# Patient Record
Sex: Female | Born: 1995 | Race: Black or African American | Hispanic: No | Marital: Single | Smoking: Never smoker
Health system: Southern US, Community
[De-identification: ages and names within clinical notes are randomized; demographics above are authoritative.]

---

## 2014-06-18 ENCOUNTER — Emergency Department (HOSPITAL_COMMUNITY): Payer: BLUE CROSS/BLUE SHIELD

## 2014-06-18 ENCOUNTER — Encounter (HOSPITAL_COMMUNITY): Payer: Self-pay | Admitting: *Deleted

## 2014-06-18 ENCOUNTER — Emergency Department (HOSPITAL_COMMUNITY)
Admission: EM | Admit: 2014-06-18 | Discharge: 2014-06-18 | Disposition: A | Discharge status: Home / Self Care | Payer: BLUE CROSS/BLUE SHIELD | Attending: Emergency Medicine | Admitting: Emergency Medicine

## 2014-06-18 DIAGNOSIS — M25511 Pain in right shoulder: Secondary | ICD-10-CM | POA: Insufficient documentation

## 2014-06-18 DIAGNOSIS — R079 Chest pain, unspecified: Secondary | ICD-10-CM | POA: Insufficient documentation

## 2014-06-18 LAB — BASIC METABOLIC PANEL
ANION GAP: 8 (ref 5–15)
BUN: 14 mg/dL (ref 6–23)
CO2: 21 mmol/L (ref 19–32)
Calcium: 9.5 mg/dL (ref 8.4–10.5)
Chloride: 107 mmol/L (ref 96–112)
Creatinine, Ser: 0.88 mg/dL (ref 0.50–1.10)
Glucose, Bld: 109 mg/dL — ABNORMAL HIGH (ref 70–99)
Potassium: 3.6 mmol/L (ref 3.5–5.1)
SODIUM: 136 mmol/L (ref 135–145)

## 2014-06-18 LAB — CBC
HEMATOCRIT: 35.6 % — AB (ref 36.0–46.0)
Hemoglobin: 11.5 g/dL — ABNORMAL LOW (ref 12.0–15.0)
MCH: 29.6 pg (ref 26.0–34.0)
MCHC: 32.3 g/dL (ref 30.0–36.0)
MCV: 91.5 fL (ref 78.0–100.0)
Platelets: 232 10*3/uL (ref 150–400)
RBC: 3.89 MIL/uL (ref 3.87–5.11)
RDW: 12.3 % (ref 11.5–15.5)
WBC: 3.8 10*3/uL — ABNORMAL LOW (ref 4.0–10.5)

## 2014-06-18 LAB — I-STAT TROPONIN, ED: TROPONIN I, POC: 0 ng/mL (ref 0.00–0.08)

## 2014-06-18 MED ORDER — PANTOPRAZOLE SODIUM 40 MG PO TBEC
40.0000 mg | DELAYED_RELEASE_TABLET | Freq: Once | ORAL | Status: AC
  Administered 2014-06-18: 40 mg via ORAL
  Filled 2014-06-18: qty 1

## 2014-06-18 MED ORDER — PANTOPRAZOLE SODIUM 20 MG PO TBEC: 20.0000 mg | DELAYED_RELEASE_TABLET | Freq: Every day | ORAL | Status: DC

## 2014-06-18 MED ORDER — GI COCKTAIL ~~LOC~~
30.0000 mL | Freq: Once | ORAL | Status: AC
  Administered 2014-06-18: 30 mL via ORAL
  Filled 2014-06-18: qty 30

## 2014-06-18 NOTE — ED Provider Notes (Signed)
CSN: [id number]     Arrival date & time 06/18/14  0246 History   First MD Initiated Contact with Patient 06/18/14 [phone]     Chief Complaint  Patient presents with  . Chest Pain     (Consider location/radiation/quality/duration/timing/severity/associated sxs/prior Treatment) Patient is a 19 y.o. female presenting with chest pain. The history is provided by the patient.  Chest Pain She complains of midsternal chest discomfort which started last night. This seems to be worse when she lays down. There is no associated dyspnea, nausea, diaphoresis. There is no exertional component. She has not taken anything to try to help this pain. She is also been having some pain in her right shoulder for the last few days. She relates that she does work out regularly but does not recall any specific injury to her right shoulder. Right shoulder pain is much worse with movement. Nothing makes it better. There is no numbness or tingling. She is a nonsmoker and there is no history of hypertension or diabetes or hyperlipidemia.  History reviewed. No pertinent past medical history. History reviewed. No pertinent past surgical history. History reviewed. No pertinent family history. History  Substance Use Topics  . Smoking status: Never Smoker   . Smokeless tobacco: Not on file  . Alcohol Use: Not on file   OB History    No data available     Review of Systems  Cardiovascular: Positive for chest pain.  All other systems reviewed and are negative.     Allergies  Review of patient's allergies indicates no known allergies.  Home Medications   Prior to Admission medications   Medication Sig Start Date End Date Taking? Authorizing Provider  pantoprazole (PROTONIX) 20 MG tablet Take 1 tablet (20 mg total) by mouth daily. 06/18/14   Dione Booze, MD   BP 107/64 mmHg  Pulse 57  Temp(Src) 98.1 F (36.7 C) (Oral)  Resp 16  Ht  (1.727 m)  Wt 138 lb (62.596 kg)  BMI 20.99 kg/m2  SpO2 100%  LMP  06/11/2014 Physical Exam  Nursing note and vitals reviewed.  19 year old female, resting comfortably and in no acute distress. Vital signs are significant for bradycardia. Oxygen saturation is 100%, which is normal. Head is normocephalic and atraumatic. PERRLA, EOMI. Oropharynx is clear. Neck is nontender and supple without adenopathy or JVD. Back is nontender and there is no CVA tenderness. Lungs are clear without rales, wheezes, or rhonchi. Chest is nontender. Heart has regular rate and rhythm without murmur. Abdomen is soft, flat, nontender without masses or hepatosplenomegaly and peristalsis is normoactive. Extremities have no cyanosis or edema, full range of motion is present. There is moderate tenderness in the right anterior deltoid groove and rotator cuff impingement signs are present. Skin is warm and dry without rash. Neurologic: Mental status is normal, cranial nerves are intact, there are no motor or sensory deficits.  ED Course  Procedures (including critical care time) Labs Review Results for orders placed or performed during the hospital encounter of 06/18/14  CBC  Result Value Ref Range   WBC 3.8 (L) 4.0 - 10.5 K/uL   RBC 3.89 3.87 - 5.11 MIL/uL   Hemoglobin 11.5 (L) 12.0 - 15.0 g/dL   HCT 11.9 (L) 14.7 - 82.9 %   MCV 91.5 78.0 - 100.0 fL   MCH 29.6 26.0 - 34.0 pg   MCHC 32.3 30.0 - 36.0 g/dL   RDW 56.2 13.0 - 86.5 %   Platelets 232 150 - 400 K/uL  Basic metabolic panel  Result Value Ref Range   Sodium 136 135 - 145 mmol/L   Potassium 3.6 3.5 - 5.1 mmol/L   Chloride 107 96 - 112 mmol/L   CO2 21 19 - 32 mmol/L   Glucose, Bld 109 (H) 70 - 99 mg/dL   BUN 14 6 - 23 mg/dL   Creatinine, Ser 8.110.88 0.50 - 1.10 mg/dL   Calcium 9.5 8.4 - 91.410.5 mg/dL   GFR calc non Af Amer >90 >90 mL/min   GFR calc Af Amer >90 >90 mL/min   Anion gap 8 5 - 15  I-stat troponin, ED [facility])  Result Value Ref Range   Troponin i, poc 0.00 0.00 - 0.08 ng/mL   Comment 3            Imaging Review Dg Chest 2 View  06/18/2014   CLINICAL DATA:  Acute onset of centralized chest pain. Initial encounter.  EXAM: CHEST  2 VIEW  COMPARISON:  None.  FINDINGS: The lungs are well-aerated and clear. There is no evidence of focal opacification, pleural effusion or pneumothorax.  The heart is normal in size; the mediastinal contour is within normal limits. No acute osseous abnormalities are seen.  IMPRESSION: No acute cardiopulmonary process seen.   Electronically Signed   By: Roanna RaiderJeffery  Chang M.D.   On: 06/18/2014 03:50     EKG Interpretation   Date/Time:  Wednesday June 18 2014 02:51:18 EDT Ventricular Rate:  64 PR Interval:  132 QRS Duration: 72 QT Interval:  414 QTC Calculation: 427 R Axis:   77 Text Interpretation:  Sinus rhythm with Premature atrial complexes  Otherwise normal ECG No old tracing to compare Confirmed by [facility]  MD,  Moroni Nester ([id number]) on 06/18/2014 2:55:07 AM      MDM   Final diagnoses:  Chest pain, unspecified chest pain type  Pain in right shoulder    Chest pain suspicious for gastroesophageal reflux disease. Right shoulder pain which appears to be rotator cuff syndrome. She was given a therapeutic trial of a GI cocktail with excellent relief of her chest discomfort. At this point, I'm concerned that giving NSAIDs for rotator cuff injury may ask he worsen her gastroesophageal reflux. I have explained this to the patient. She is referred to orthopedics for follow-up and has been given a prescription for pantoprazole.    Dione Boozeavid Brayn Eckstein, MD 06/18/14 [phone]

## 2014-06-18 NOTE — ED Notes (Signed)
Pt. Refused wheelchair and left with all belongings 

## 2014-06-18 NOTE — ED Notes (Signed)
Pt in c/o central chest pain that is worse with movement, also right shoulder pain that is worse with movement, no distress noted

## 2014-06-18 NOTE — Discharge Instructions (Signed)
Gastroesophageal Reflux Disease, Adult Gastroesophageal reflux disease (GERD) happens when acid from your stomach flows up into the esophagus. When acid comes in contact with the esophagus, the acid causes soreness (inflammation) in the esophagus. Over time, GERD may create small holes (ulcers) in the lining of the esophagus. CAUSES   Increased body weight. This puts pressure on the stomach, making acid rise from the stomach into the esophagus.  Smoking. This increases acid production in the stomach.  Drinking alcohol. This causes decreased pressure in the lower esophageal sphincter (valve or ring of muscle between the esophagus and stomach), allowing acid from the stomach into the esophagus.  Late evening meals and a full stomach. This increases pressure and acid production in the stomach.  A malformed lower esophageal sphincter. Sometimes, no cause is found. SYMPTOMS   Burning pain in the lower part of the mid-chest behind the breastbone and in the mid-stomach area. This may occur twice a week or more often.  Trouble swallowing.  Sore throat.  Dry cough.  Asthma-like symptoms including chest tightness, shortness of breath, or wheezing. DIAGNOSIS  Your caregiver may be able to diagnose GERD based on your symptoms. In some cases, X-rays and other tests may be done to check for complications or to check the condition of your stomach and esophagus. TREATMENT  Your caregiver may recommend over-the-counter or prescription medicines to help decrease acid production. Ask your caregiver before starting or adding any new medicines.  HOME CARE INSTRUCTIONS   Change the factors that you can control. Ask your caregiver for guidance concerning weight loss, quitting smoking, and alcohol consumption.  Avoid foods and drinks that make your symptoms worse, such as:  Caffeine or alcoholic drinks.  Chocolate.  Peppermint or mint flavorings.  Garlic and onions.  Spicy foods.  Citrus fruits,  such as oranges, lemons, or limes.  Tomato-based foods such as sauce, chili, salsa, and pizza.  Fried and fatty foods.  Avoid lying down for the 3 hours prior to your bedtime or prior to taking a nap.  Eat small, frequent meals instead of large meals.  Wear loose-fitting clothing. Do not wear anything tight around your waist that causes pressure on your stomach.  Raise the head of your bed 6 to 8 inches with wood blocks to help you sleep. Extra pillows will not help.  Only take over-the-counter or prescription medicines for pain, discomfort, or fever as directed by your caregiver.  Do not take aspirin, ibuprofen, or other nonsteroidal anti-inflammatory drugs (NSAIDs). SEEK IMMEDIATE MEDICAL CARE IF:   You have pain in your arms, neck, jaw, teeth, or back.  Your pain increases or changes in intensity or duration.  You develop nausea, vomiting, or sweating (diaphoresis).  You develop shortness of breath, or you faint.  Your vomit is green, yellow, black, or looks like coffee grounds or blood.  Your stool is red, bloody, or black. These symptoms could be signs of other problems, such as heart disease, gastric bleeding, or esophageal bleeding. MAKE SURE YOU:   Understand these instructions.  Will watch your condition.  Will get help right away if you are not doing well or get worse. Document Released: 11/17/2004 Document Revised: 05/02/2011 Document Reviewed: 08/27/2010 [facility] Patient Information 2015 [place], [place]. This information is not intended to replace advice given to you by your health care provider. Make sure you discuss any questions you have with your health care provider.  Rotator Cuff Injury Rotator cuff injury is any type of injury to the set of  muscles and tendons that make up the stabilizing unit of your shoulder. This unit holds the ball of your upper arm bone (humerus) in the socket of your shoulder blade (scapula).  CAUSES Injuries to your rotator cuff  most commonly come from sports or activities that cause your arm to be moved repeatedly over your head. Examples of this include throwing, weight lifting, swimming, or racquet sports. Long lasting (chronic) irritation of your rotator cuff can cause soreness and swelling (inflammation), bursitis, and eventual damage to your tendons, such as a tear (rupture). SIGNS AND SYMPTOMS Acute rotator cuff tear:  Sudden tearing sensation followed by severe pain shooting from your upper shoulder down your arm toward your elbow.  Decreased range of motion of your shoulder because of pain and muscle spasm.  Severe pain.  Inability to raise your arm out to the side because of pain and loss of muscle power (large tears). Chronic rotator cuff tear:  Pain that usually is worse at night and may interfere with sleep.  Gradual weakness and decreased shoulder motion as the pain worsens.  Decreased range of motion. Rotator cuff tendinitis:  Deep ache in your shoulder and the outside upper arm over your shoulder.  Pain that comes on gradually and becomes worse when lifting your arm to the side or turning it inward. DIAGNOSIS Rotator cuff injury is diagnosed through a medical history, physical exam, and imaging exam. The medical history helps determine the type of rotator cuff injury. Your health care provider will look at your injured shoulder, feel the injured area, and ask you to move your shoulder in different positions. X-ray exams typically are done to rule out other causes of shoulder pain, such as fractures. MRI is the exam of choice for the most severe shoulder injuries because the images show muscles and tendons.  TREATMENT  Chronic tear:  Medicine for pain, such as acetaminophen or ibuprofen.  Physical therapy and range-of-motion exercises may be helpful in maintaining shoulder function and strength.  Steroid injections into your shoulder joint.  Surgical repair of the rotator cuff if the injury  does not heal with noninvasive treatment. Acute tear:  Anti-inflammatory medicines such as ibuprofen and naproxen to help reduce pain and swelling.  A sling to help support your arm and rest your rotator cuff muscles. Long-term use of a sling is not advised. It may cause significant stiffening of the shoulder joint.  Surgery may be considered within a few weeks, especially in younger, active people, to return the shoulder to full function.  Indications for surgical treatment include the following:  Age younger than 60 years.  Rotator cuff tears that are complete.  Physical therapy, rest, and anti-inflammatory medicines have been used for 6-8 weeks, with no improvement.  Employment or sporting activity that requires constant shoulder use. Tendinitis:  Anti-inflammatory medicines such as ibuprofen and naproxen to help reduce pain and swelling.  A sling to help support your arm and rest your rotator cuff muscles. Long-term use of a sling is not advised. It may cause significant stiffening of the shoulder joint.  Severe tendinitis may require:  Steroid injections into your shoulder joint.  Physical therapy.  Surgery. HOME CARE INSTRUCTIONS   Apply ice to your injury:  Put ice in a plastic bag.  Place a towel between your skin and the bag.  Leave the ice on for 20 minutes, 2-3 times a day.  If you have a shoulder immobilizer (sling and straps), wear it until told otherwise by your health  care provider.  You may want to sleep on several pillows or in a recliner at night to lessen swelling and pain.  Only take over-the-counter or prescription medicines for pain, discomfort, or fever as directed by your health care provider.  Do simple hand squeezing exercises with a soft rubber ball to decrease hand swelling. SEEK MEDICAL CARE IF:   Your shoulder pain increases, or new pain or numbness develops in your arm, hand, or fingers.  Your hand or fingers are colder than your  other hand. SEEK IMMEDIATE MEDICAL CARE IF:   Your arm, hand, or fingers are numb or tingling.  Your arm, hand, or fingers are increasingly swollen and painful, or they turn white or blue. MAKE SURE YOU:  Understand these instructions.  Will watch your condition.  Will get help right away if you are not doing well or get worse. Document Released: 02/05/2000 Document Revised: 02/12/2013 Document Reviewed: 09/19/2012 [facility] Patient Information 2015 [place], [place]. This information is not intended to replace advice given to you by your health care provider. Make sure you discuss any questions you have with your health care provider.  Pantoprazole tablets What is this medicine? PANTOPRAZOLE (pan TOE pra zole) prevents the production of acid in the stomach. It is used to treat gastroesophageal reflux disease (GERD), inflammation of the esophagus, and Zollinger-Ellison syndrome. This medicine may be used for other purposes; ask your health care provider or pharmacist if you have questions. COMMON BRAND NAME(S): Protonix What should I tell my health care provider before I take this medicine? They need to know if you have any of these conditions: -liver disease -low levels of magnesium in the blood -an unusual or allergic reaction to omeprazole, lansoprazole, pantoprazole, rabeprazole, other medicines, foods, dyes, or preservatives -pregnant or trying to get pregnant -breast-feeding How should I use this medicine? Take this medicine by mouth. Swallow the tablets whole with a drink of water. Follow the directions on the prescription label. Do not crush, break, or chew. Take your medicine at regular intervals. Do not take your medicine more often than directed. Talk to your pediatrician regarding the use of this medicine in children. While this drug may be prescribed for children as young as 5 years for selected conditions, precautions do apply. Overdosage: If you think you have taken too  much of this medicine contact a poison control center or emergency room at once. NOTE: This medicine is only for you. Do not share this medicine with others. What if I miss a dose? If you miss a dose, take it as soon as you can. If it is almost time for your next dose, take only that dose. Do not take double or extra doses. What may interact with this medicine? Do not take this medicine with any of the following medications: -atazanavir -nelfinavir This medicine may also interact with the following medications: -ampicillin -delavirdine -digoxin -diuretics -iron salts -medicines for fungal infections like ketoconazole, itraconazole and voriconazole -warfarin This list may not describe all possible interactions. Give your health care provider a list of all the medicines, herbs, non-prescription drugs, or dietary supplements you use. Also tell them if you smoke, drink alcohol, or use illegal drugs. Some items may interact with your medicine. What should I watch for while using this medicine? It can take several days before your stomach pain gets better. Check with your doctor or health care professional if your condition does not start to get better, or if it gets worse. You may need blood work  done while you are taking this medicine. What side effects may I notice from receiving this medicine? Side effects that you should report to your doctor or health care professional as soon as possible: -allergic reactions like skin rash, itching or hives, swelling of the face, lips, or tongue -bone, muscle or joint pain -breathing problems -chest pain or chest tightness -dark yellow or brown urine -dizziness -fast, irregular heartbeat -feeling faint or lightheaded -fever or sore throat -muscle spasm -palpitations -redness, blistering, peeling or loosening of the skin, including inside the mouth -seizures -tremors -unusual bleeding or bruising -unusually weak or tired -yellowing of the eyes  or skin Side effects that usually do not require medical attention (Report these to your doctor or health care professional if they continue or are bothersome.): -constipation -diarrhea -dry mouth -headache -nausea This list may not describe all possible side effects. Call your doctor for medical advice about side effects. You may report side effects to FDA at 1-800-FDA-1088. Where should I keep my medicine? Keep out of the reach of children. Store at room temperature between 15 and 30 degrees C (59 and 86 degrees F). Protect from light and moisture. Throw away any unused medicine after the expiration date. NOTE: This sheet is a summary. It may not cover all possible information. If you have questions about this medicine, talk to your doctor, pharmacist, or health care provider.  2015, Elsevier/[employer]. (2011-12-07 16:40:16)

## 2015-01-05 ENCOUNTER — Emergency Department (HOSPITAL_COMMUNITY)
Admission: EM | Admit: 2015-01-05 | Discharge: 2015-01-05 | Disposition: A | Discharge status: Home / Self Care | Payer: BLUE CROSS/BLUE SHIELD | Attending: Emergency Medicine | Admitting: Emergency Medicine

## 2015-01-05 ENCOUNTER — Encounter (HOSPITAL_COMMUNITY): Payer: Self-pay | Admitting: *Deleted

## 2015-01-05 DIAGNOSIS — J069 Acute upper respiratory infection, unspecified: Secondary | ICD-10-CM

## 2015-01-05 DIAGNOSIS — R1310 Dysphagia, unspecified: Secondary | ICD-10-CM | POA: Insufficient documentation

## 2015-01-05 DIAGNOSIS — B9789 Other viral agents as the cause of diseases classified elsewhere: Secondary | ICD-10-CM

## 2015-01-05 DIAGNOSIS — Z79899 Other long term (current) drug therapy: Secondary | ICD-10-CM | POA: Insufficient documentation

## 2015-01-05 MED ORDER — PHENYLEPH-PROMETHAZINE-COD 5-6.25-10 MG/5ML PO SYRP: 5.0000 mL | ORAL_SOLUTION | Freq: Four times a day (QID) | ORAL | Status: DC | PRN

## 2015-01-05 NOTE — Discharge Instructions (Signed)
Upper Respiratory Infection, Adult Most upper respiratory infections (URIs) are a viral infection of the air passages leading to the lungs. A URI affects the nose, throat, and upper air passages. The most common type of URI is nasopharyngitis and is typically referred to as "the common cold." URIs run their course and usually go away on their own. Most of the time, a URI does not require medical attention, but sometimes a bacterial infection in the upper airways can follow a viral infection. This is called a secondary infection. Sinus and middle ear infections are common types of secondary upper respiratory infections. Bacterial pneumonia can also complicate a URI. A URI can worsen asthma and chronic obstructive pulmonary disease (COPD). Sometimes, these complications can require emergency medical care and may be life threatening.  CAUSES Almost all URIs are caused by viruses. A virus is a type of germ and can spread from one person to another.  RISKS FACTORS You may be at risk for a URI if:   You smoke.   You have chronic heart or lung disease.  You have a weakened defense (immune) system.   You are very young or very old.   You have nasal allergies or asthma.  You work in crowded or poorly ventilated areas.  You work in health care facilities or schools. SIGNS AND SYMPTOMS  Symptoms typically develop 2-3 days after you come in contact with a cold virus. Most viral URIs last 7-10 days. However, viral URIs from the influenza virus (flu virus) can last 14-18 days and are typically more severe. Symptoms may include:   Runny or stuffy (congested) nose.   Sneezing.   Cough.   Sore throat.   Headache.   Fatigue.   Fever.   Loss of appetite.   Pain in your forehead, behind your eyes, and over your cheekbones (sinus pain).  Muscle aches.  DIAGNOSIS  Your health care provider may diagnose a URI by:  Physical exam.  Tests to check that your symptoms are not due to  another condition such as:  Strep throat.  Sinusitis.  Pneumonia.  Asthma. TREATMENT  A URI goes away on its own with time. It cannot be cured with medicines, but medicines may be prescribed or recommended to relieve symptoms. Medicines may help:  Reduce your fever.  Reduce your cough.  Relieve nasal congestion. HOME CARE INSTRUCTIONS   Take medicines only as directed by your health care provider.   Gargle warm saltwater or take cough drops to comfort your throat as directed by your health care provider.  Use a warm mist humidifier or inhale steam from a shower to increase air moisture. This may make it easier to breathe.  Drink enough fluid to keep your urine clear or pale yellow.   Eat soups and other clear broths and maintain good nutrition.   Rest as needed.   Return to work when your temperature has returned to normal or as your health care provider advises. You may need to stay home longer to avoid infecting others. You can also use a face mask and careful hand washing to prevent spread of the virus.  Increase the usage of your inhaler if you have asthma.   Do not use any tobacco products, including cigarettes, chewing tobacco, or electronic cigarettes. If you need help quitting, ask your health care provider. PREVENTION  The best way to protect yourself from getting a cold is to practice good hygiene.   Avoid oral or hand contact with people with cold   symptoms.   Wash your hands often if contact occurs.  There is no clear evidence that vitamin C, vitamin E, echinacea, or exercise reduces the chance of developing a cold. However, it is always recommended to get plenty of rest, exercise, and practice good nutrition.  SEEK MEDICAL CARE IF:   You are getting worse rather than better.   Your symptoms are not controlled by medicine.   You have chills.  You have worsening shortness of breath.  You have brown or red mucus.  You have yellow or brown nasal  discharge.  You have pain in your face, especially when you bend forward.  You have a fever.  You have swollen neck glands.  You have pain while swallowing.  You have white areas in the back of your throat. SEEK IMMEDIATE MEDICAL CARE IF:   You have severe or persistent:  Headache.  Ear pain.  Sinus pain.  Chest pain.  You have chronic lung disease and any of the following:  Wheezing.  Prolonged cough.  Coughing up blood.  A change in your usual mucus.  You have a stiff neck.  You have changes in your:  Vision.  Hearing.  Thinking.  Mood. MAKE SURE YOU:   Understand these instructions.  Will watch your condition.  Will get help right away if you are not doing well or get worse.   This information is not intended to replace advice given to you by your health care provider. Make sure you discuss any questions you have with your health care provider.   Document Released: 08/03/2000 Document Revised: 06/24/2014 Document Reviewed: 05/15/2013 Elsevier Interactive Patient Education 2016 [employer].  

## 2015-01-05 NOTE — ED Notes (Signed)
Pt in c/o intermittent sore throat and cough for the last two weeks, worse in the last few days, denies fever, no distress noted

## 2015-01-05 NOTE — ED Provider Notes (Signed)
CSN: [id number]     Arrival date & time 01/05/15  1809 History  By signing my name below, I, Alisha Fuentes, attest that this documentation has been prepared under the direction and in the presence of Alisha Mornavid Yoni Lobos, NP. Electronically Signed: Murriel HopperAlec Fuentes, ED Scribe. 01/05/2015. 7:46 PM.    Chief Complaint  Patient presents with  . URI      Patient is a 19 y.o. female presenting with URI. The history is provided by the patient. No language interpreter was used.  URI Presenting symptoms: cough   Presenting symptoms: no ear pain and no fever   Severity:  Mild Onset quality:  Gradual Duration:  2 weeks Timing:  Constant Chronicity:  New Associated symptoms: no wheezing    HPI Comments: Alisha Fuentes is a 19 y.o. female who presents to the Emergency Department complaining of constant, worsening sore throat with associated productive cough and trouble swallowing that has been present for about two weeks. Pt reports her cough is worse at night, especially when she lays down. Pt denies fever, wheezing, abdominal pain, nausea, vomiting, ear pain.   History reviewed. No pertinent past medical history. History reviewed. No pertinent past surgical history. History reviewed. No pertinent family history. Social History  Substance Use Topics  . Smoking status: Never Smoker   . Smokeless tobacco: None  . Alcohol Use: None   OB History    No data available     Review of Systems  Constitutional: Negative for fever.  HENT: Positive for trouble swallowing. Negative for ear pain.   Respiratory: Positive for cough. Negative for wheezing.   Gastrointestinal: Negative for nausea, vomiting and abdominal pain.  All other systems reviewed and are negative.     Allergies  Review of patient's allergies indicates no known allergies.  Home Medications   Prior to Admission medications   Medication Sig Start Date End Date Taking? Authorizing Provider  pantoprazole (PROTONIX) 20 MG tablet Take  1 tablet (20 mg total) by mouth daily. 06/18/14   Alisha Boozeavid Glick, MD   BP 109/66 mmHg  Pulse 62  Temp(Src) 97.8 F (36.6 C) (Oral)  Resp 20  SpO2 100%  LMP 12/29/2014 Physical Exam  Constitutional: She is oriented to person, place, and time. She appears well-developed and well-nourished.  HENT:  Head: Normocephalic and atraumatic.  Cobblestoning in lower pharynx No cervical lymphadenopathy  Cardiovascular: Normal rate.   Pulmonary/Chest: Effort normal.  Abdominal: She exhibits no distension.  Lymphadenopathy:    She has no cervical adenopathy.  Neurological: She is alert and oriented to person, place, and time.  Skin: Skin is warm and dry.  Psychiatric: She has a normal mood and affect.  Nursing note and vitals reviewed.   ED Course  Procedures (including critical care time)  DIAGNOSTIC STUDIES: Oxygen Saturation is 100% on room air, normal by my interpretation.    COORDINATION OF CARE: 7:46 PM Discussed treatment plan with pt at bedside and pt agreed to plan.   Labs Review Labs Reviewed - No data to display  Imaging Review No results found. I have personally reviewed and evaluated these images and lab results as part of my medical decision-making.   EKG Interpretation None      MDM   Final diagnoses:  None    Viral URI with cough. Care instructions provided. Return precautions discussed.  I personally performed the services described in this documentation, which was scribed in my presence. The recorded information has been reviewed and is accurate.    Alisha Fuentes  Alisha Blazing, NP 01/06/15 1610  Mancel Bale, MD 01/08/15 [phone]

## 2016-05-10 ENCOUNTER — Ambulatory Visit (INDEPENDENT_AMBULATORY_CARE_PROVIDER_SITE_OTHER): Payer: Self-pay | Admitting: Physician Assistant

## 2016-05-10 VITALS — BP 108/60 | HR 83 | Temp 101.3°F | Resp 16 | Ht 67.0 in | Wt 136.4 lb

## 2016-05-10 DIAGNOSIS — B9689 Other specified bacterial agents as the cause of diseases classified elsewhere: Secondary | ICD-10-CM

## 2016-05-10 DIAGNOSIS — R509 Fever, unspecified: Secondary | ICD-10-CM

## 2016-05-10 DIAGNOSIS — N760 Acute vaginitis: Secondary | ICD-10-CM

## 2016-05-10 DIAGNOSIS — R112 Nausea with vomiting, unspecified: Secondary | ICD-10-CM

## 2016-05-10 DIAGNOSIS — N10 Acute tubulo-interstitial nephritis: Secondary | ICD-10-CM

## 2016-05-10 DIAGNOSIS — R102 Pelvic and perineal pain: Secondary | ICD-10-CM

## 2016-05-10 LAB — POCT URINALYSIS DIP (MANUAL ENTRY)
BILIRUBIN UA: NEGATIVE
BILIRUBIN UA: NEGATIVE
GLUCOSE UA: NEGATIVE
Leukocytes, UA: NEGATIVE
Nitrite, UA: POSITIVE — AB
Protein Ur, POC: 30 — AB
SPEC GRAV UA: 1.03 (ref 1.030–1.035)
Urobilinogen, UA: 1 (ref ?–2.0)
pH, UA: 6 (ref 5.0–8.0)

## 2016-05-10 LAB — POC MICROSCOPIC URINALYSIS (UMFC): Mucus: ABSENT

## 2016-05-10 LAB — POCT WET + KOH PREP
TRICH BY WET PREP: ABSENT
Yeast by KOH: ABSENT
Yeast by wet prep: ABSENT

## 2016-05-10 LAB — POCT URINE PREGNANCY: Preg Test, Ur: NEGATIVE

## 2016-05-10 MED ORDER — METRONIDAZOLE 500 MG PO TABS: 500.0000 mg | ORAL_TABLET | Freq: Two times a day (BID) | ORAL | 0 refills | Status: AC

## 2016-05-10 MED ORDER — CIPROFLOXACIN HCL 500 MG PO TABS: 500.0000 mg | ORAL_TABLET | Freq: Two times a day (BID) | ORAL | 0 refills | Status: AC

## 2016-05-10 MED ORDER — ONDANSETRON 8 MG PO TBDP: 8.0000 mg | ORAL_TABLET | Freq: Three times a day (TID) | ORAL | 0 refills | Status: AC | PRN

## 2016-05-10 MED ORDER — ACETAMINOPHEN 500 MG PO TABS
500.0000 mg | ORAL_TABLET | Freq: Once | ORAL | Status: AC
  Administered 2016-05-10: 500 mg via ORAL

## 2016-05-10 NOTE — Progress Notes (Signed)
PRIMARY CARE AT [facility] [address], [place] [address] [phone]  Date:  05/10/2016   Name:  Alisha Fuentes   DOB:  11-24-1995   MRN:  [id number]  PCP:  No PCP Per Patient    History of Present Illness:  Alisha Fuentes is a 21 y.o. female patient who presents to PCP with  Chief Complaint  Patient presents with  . Fever    Pt states whenever she has an alcoholic beverage she has these symptoms. Per pt she only had one glass of wine last night  . Chills  . Abdominal Pain    & Headache  . Generalized Body Aches     Waking with fever and chills and migraine, which will last for 1 day and return.  She will have alcohol, and this will start up.  She had a glass of wine--abdominal area felt tight and she felt woozy.  She then developed chills.  No dysuria, or hematuria.  She does have frequency.  Mild back pain. No congestion, sore throat, or coughing.   No abnormal discharge, color, or pruritus.   Sexually active, unprotected, months ago.     There are no active problems to display for this patient.   No past medical history on file.  No past surgical history on file.  Social History  Substance Use Topics  . Smoking status: Never Smoker  . Smokeless tobacco: Never Used  . Alcohol use Not on file    No family history on file.  No Known Allergies  Medication list has been reviewed and updated.  No current outpatient prescriptions on file prior to visit.   No current facility-administered medications on file prior to visit.     ROS ROS otherwise unremarkable unless listed above.  Physical Examination: BP 108/60   Pulse 83   Temp (!) 101.3 F (38.5 C) (Oral)   Resp 16   Ht 5\' 7"  (1.702 m)   Wt 136 lb 6.4 oz (61.9 kg)   SpO2 97%   BMI 21.36 kg/m  Ideal Body Weight: Weight in (lb) to have BMI = 25: 159.3  Physical Exam  Constitutional: She is oriented to person, place, and time. She appears well-developed and well-nourished. No distress.  HENT:   Head: Normocephalic and atraumatic.  Right Ear: External ear normal.  Left Ear: External ear normal.  Eyes: Conjunctivae and EOM are normal. Pupils are equal, round, and reactive to light.  Cardiovascular: Normal rate.   Pulmonary/Chest: Effort normal. No respiratory distress.  Abdominal: Soft. Normal appearance and bowel sounds are normal. There is tenderness in the suprapubic area. There is no CVA tenderness and no tenderness at McBurney's point.  Neurological: She is alert and oriented to person, place, and time.  Skin: She is not diaphoretic.  Psychiatric: She has a normal mood and affect. Her behavior is normal.    Results for orders placed or performed in visit on 05/10/16  POCT urinalysis dipstick  Result Value Ref Range   Color, UA yellow yellow   Clarity, UA cloudy (A) clear   Glucose, UA negative negative   Bilirubin, UA negative negative   Ketones, POC UA negative negative   Spec Grav, UA 1.030 1.030 - 1.035   Blood, UA large (A) negative   pH, UA 6.0 5.0 - 8.0   Protein Ur, POC =30 (A) negative   Urobilinogen, UA 1.0 Negative - 2.0   Nitrite, UA Positive (A) Negative   Leukocytes, UA Negative Negative   Assessment and  Plan: Alisha Fuentes is a 21 y.o. female who is here today for cc of abdominal pain, fever, chills, and bodyaches. --likely bladder infection.  Will cover for pyelo given fever and sxs.   --advised alarming sxs to warrant an immediate return. Fever, unspecified fever cause - Plan: acetaminophen (TYLENOL) tablet 500 mg, POCT urinalysis dipstick, POCT Wet + KOH Prep, POCT urine pregnancy, POCT Microscopic Urinalysis ([facility])  Suprapubic pain - Plan: POCT Wet + KOH Prep, POCT urine pregnancy, POCT Microscopic Urinalysis ([facility])  Acute pyelonephritis - Plan: ciprofloxacin (CIPRO) 500 MG tablet  Nausea and vomiting, intractability of vomiting not specified, unspecified vomiting type - Plan: ondansetron (ZOFRAN-ODT) 8 MG disintegrating tablet  Bacterial  vaginosis - Plan: metroNIDAZOLE (FLAGYL) 500 MG tablet  Trena PlattStephanie English, PA-C [facility] 05/13/2016 828am

## 2016-05-10 NOTE — Patient Instructions (Addendum)
I would like you to continue to hydrate. You can take tylenol or ibuprofen for your pain or fever.  Pyelonephritis, Adult Pyelonephritis is a kidney infection. The kidneys are organs that help clean your blood by moving waste out of your blood and into your pee (urine). This infection can happen quickly, or it can last for a long time. In most cases, it clears up with treatment and does not cause other problems. Follow these instructions at home: Medicines   Take over-the-counter and prescription medicines only as told by your doctor.  Take your antibiotic medicine as told by your doctor. Do not stop taking the medicine even if you start to feel better. General instructions   Drink enough fluid to keep your pee clear or pale yellow.  Avoid caffeine, tea, and carbonated drinks.  Pee (urinate) often. Avoid holding in pee for long periods of time.  Pee before and after sex.  After pooping (having a bowel movement), women should wipe from front to back. Use each tissue only once.  Keep all follow-up visits as told by your doctor. This is important. Contact a doctor if:  You do not feel better after 2 days.  Your symptoms get worse.  You have a fever. Get help right away if:  You cannot take your medicine or drink fluids as told.  You have chills and shaking.  You throw up (vomit).  You have very bad pain in your side (flank) or back.  You feel very weak or you pass out (faint). This information is not intended to replace advice given to you by your health care provider. Make sure you discuss any questions you have with your health care provider. Document Released: 03/17/2004 Document Revised: 07/16/2015 Document Reviewed: 06/02/2014 Elsevier Interactive Patient Education  2017 [employer]     IF you received an x-ray today, you will receive an invoice from [facility]. Please contact [facility] at [phone] with questions or concerns regarding  your invoice.   IF you received labwork today, you will receive an invoice from [facility]. Please contact [facility] at [phone] with questions or concerns regarding your invoice.   Our billing staff will not be able to assist you with questions regarding bills from these companies.  You will be contacted with the lab results as soon as they are available. The fastest way to get your results is to activate your My Chart account. Instructions are located on the last page of this paperwork. If you have not heard from us regarding the results in 2 weeks, please contact this office.

## 2016-05-12 ENCOUNTER — Encounter: Payer: Self-pay | Admitting: Emergency Medicine

## 2016-05-16 ENCOUNTER — Encounter: Payer: Self-pay | Admitting: Emergency Medicine

## 2017-03-22 ENCOUNTER — Encounter (HOSPITAL_COMMUNITY): Payer: Self-pay | Admitting: Emergency Medicine

## 2017-03-22 ENCOUNTER — Ambulatory Visit (HOSPITAL_COMMUNITY)
Admission: EM | Admit: 2017-03-22 | Discharge: 2017-03-22 | Disposition: A | Discharge status: Home / Self Care | Payer: BLUE CROSS/BLUE SHIELD | Attending: Family Medicine | Admitting: Family Medicine

## 2017-03-22 DIAGNOSIS — R05 Cough: Secondary | ICD-10-CM | POA: Diagnosis not present

## 2017-03-22 DIAGNOSIS — J029 Acute pharyngitis, unspecified: Secondary | ICD-10-CM | POA: Insufficient documentation

## 2017-03-22 LAB — POCT RAPID STREP A: Streptococcus, Group A Screen (Direct): NEGATIVE

## 2017-03-22 NOTE — ED Triage Notes (Signed)
PT C/O: cold sx onset today associated w/SOB, sore throat.  Sts throat hurts when she talks.   DENIES: fevers  TAKING MEDS: none   A&O x4... NAD... Ambulatory

## 2017-03-22 NOTE — Discharge Instructions (Signed)
You may use over the counter ibuprofen or acetaminophen as needed.  °For a sore throat, over the counter products such as [employer] Peroxyl Mouth Sore Rinse and Chloraseptic Sore Throat Spray may provide some temporary relief. °Your rapid strep test was negative today. We have sent your throat swab for culture and will let you know of any positive results. °

## 2017-03-25 LAB — CULTURE, GROUP A STREP (THRC)

## 2017-03-27 NOTE — ED Provider Notes (Signed)
  [facility]   [id number] 03/22/17 Arrival Time: 1421  ASSESSMENT & PLAN:  1. Sore throat    Results for orders placed or performed during the hospital encounter of 03/22/17  POCT rapid strep A [facility])  Result Value Ref Range   Streptococcus, Group A Screen (Direct) NEGATIVE NEGATIVE   Labs Reviewed  CULTURE, GROUP A STREP [facility])  POCT RAPID STREP A   OTC analgesics and throat care as needed Throat culture pending.  Reviewed expectations re: course of current medical issues. Questions answered. Outlined signs and symptoms indicating need for more acute intervention. Patient verbalized understanding. After Visit Summary given.   SUBJECTIVE:  Alisha Severinnesha Fuentes is a 22 y.o. female who reports a sore throat. Describes as pain with swallowing. Onset abrupt beginning 1 day ago. No respiratory symptoms except for very mild and sporadic dry cough. Normal PO intake but reports discomfort with swallowing. Fever reported: no. No associated n/v/abdominal symptoms. Sick contacts: None.  OTC treatment: None.  ROS: As per HPI.   OBJECTIVE:  Vitals:   03/22/17 1451  BP: (!) 87/44  Pulse: 64  Resp: 18  Temp: 98.9 F (37.2 C)  TempSrc: Oral  SpO2: 100%     General appearance: alert; no distress HEENT: throat with mild erythema; uvula midline Neck: supple with FROM; no cervical LAD Lungs: clear to auscultation bilaterally Skin: reveals no rash; warm and dry Psychological: alert and cooperative; normal mood and affect  No Known Allergies   Social History   Socioeconomic History  . Marital status: Single    Spouse name: Not on file  . Number of children: Not on file  . Years of education: Not on file  . Highest education level: Not on file  Social Needs  . Financial resource strain: Not on file  . Food insecurity - worry: Not on file  . Food insecurity - inability: Not on file  . Transportation needs - medical: Not on file  . Transportation needs -  non-medical: Not on file  Occupational History  . Not on file  Tobacco Use  . Smoking status: Never Smoker  . Smokeless tobacco: Never Used  Substance and Sexual Activity  . Alcohol use: Not on file  . Drug use: Not on file  . Sexual activity: Not on file  Other Topics Concern  . Not on file  Social History Narrative  . Not on file   History reviewed. No pertinent family history.        Mardella LaymanHagler, Dereona Kolodny, MD 03/27/17 0930

## 2017-05-07 ENCOUNTER — Encounter (HOSPITAL_COMMUNITY): Payer: Self-pay | Admitting: *Deleted

## 2017-05-07 ENCOUNTER — Emergency Department (HOSPITAL_COMMUNITY)
Admission: EM | Admit: 2017-05-07 | Discharge: 2017-05-07 | Disposition: A | Discharge status: Home / Self Care | Payer: BLUE CROSS/BLUE SHIELD | Attending: Emergency Medicine | Admitting: Emergency Medicine

## 2017-05-07 ENCOUNTER — Other Ambulatory Visit: Payer: Self-pay

## 2017-05-07 ENCOUNTER — Emergency Department (HOSPITAL_COMMUNITY): Payer: BLUE CROSS/BLUE SHIELD

## 2017-05-07 DIAGNOSIS — W208XXA Other cause of strike by thrown, projected or falling object, initial encounter: Secondary | ICD-10-CM | POA: Insufficient documentation

## 2017-05-07 DIAGNOSIS — S63615A Unspecified sprain of left ring finger, initial encounter: Secondary | ICD-10-CM | POA: Insufficient documentation

## 2017-05-07 DIAGNOSIS — S6992XA Unspecified injury of left wrist, hand and finger(s), initial encounter: Secondary | ICD-10-CM | POA: Diagnosis present

## 2017-05-07 DIAGNOSIS — Y9389 Activity, other specified: Secondary | ICD-10-CM | POA: Insufficient documentation

## 2017-05-07 DIAGNOSIS — Y9289 Other specified places as the place of occurrence of the external cause: Secondary | ICD-10-CM | POA: Insufficient documentation

## 2017-05-07 DIAGNOSIS — Y999 Unspecified external cause status: Secondary | ICD-10-CM | POA: Insufficient documentation

## 2017-05-07 LAB — POC URINE PREG, ED: Preg Test, Ur: NEGATIVE

## 2017-05-07 MED ORDER — IBUPROFEN 600 MG PO TABS: 600.0000 mg | ORAL_TABLET | Freq: Four times a day (QID) | ORAL | 0 refills | Status: DC | PRN

## 2017-05-07 NOTE — Discharge Instructions (Signed)
Please see the information and instructions below regarding your visit.  Your diagnoses today include:  1. Sprain of left ring finger, unspecified site of finger, initial encounter    Your x-ray demonstrated no fractures today.  You likely have a sprain within your finger.  Tests performed today include: See side panel of your discharge paperwork for testing performed today. Vital signs are listed at the bottom of these instructions.   Medications prescribed:    Take any prescribed medications only as prescribed, and any over the counter medications only as directed on the packaging.  You are prescribed ibuprofen, a non-steroidal anti-inflammatory agent (NSAID) for pain. You may take 600mg  every 6 hours as needed for pain. If still requiring this medication around the clock for acute pain after 10 days, please see your primary healthcare provider.  Women who are pregnant, breastfeeding, or planning on becoming pregnant should not take non-steroidal anti-inflammatories such as   You may combine this medication with Tylenol, 650 mg every 6 hours, so you are receiving something for pain every 3 hours.  This is not a long-term medication unless under the care and direction of your primary provider. Taking this medication long-term and not under the supervision of a healthcare provider could increase the risk of stomach ulcers, kidney problems, and cardiovascular problems such as high blood pressure.    Home care instructions:  Please follow any educational materials contained in this packet.   Please ice the finger 4 times a day, placing a towel in between your eyes and the finger.  Please also perform range of motion exercises, including using a stress ball to be able to practice grip strength.  Follow-up instructions: Please follow-up with your primary care provider in one week for further evaluation of your symptoms if they are not completely improved.   Return instructions:  Please  return to the Emergency Department if you experience worsening symptoms.  Return the emergency department for any increasing swelling, the finger turning white or numb. Please return if you have any other emergent concerns.  Additional Information:   Your vital signs today were: BP 95/68 (BP Location: Right Arm)    Pulse 80    Temp 98.4 F (36.9 C) (Oral)    Resp 16    Ht 5\' 8"  (1.727 m)    Wt 63.5 kg (140 lb)    LMP 04/15/2017    SpO2 100%    BMI 21.29 kg/m  If your blood pressure (BP) was elevated on multiple readings during this visit above 130 for the top number or above 80 for the bottom number, please have this repeated by your primary care provider within one month. --------------  Thank you for allowing us to participate in your care today.

## 2017-05-07 NOTE — ED Notes (Signed)
Declined W/C at D/C and was escorted to lobby by RN. 

## 2017-05-07 NOTE — ED Notes (Signed)
Urine Pregnancy POC reads Negative. Ed-lab

## 2017-05-07 NOTE — ED Triage Notes (Signed)
Pt was getting object off of upper shelf when the TV fell on her L ring finger.  She feels it is broken.

## 2017-05-07 NOTE — ED Provider Notes (Signed)
[facility] Provider Note   CSN: [id number] Arrival date & time: 05/07/17  1551     History   Chief Complaint Chief Complaint  Patient presents with  . Finger Injury    HPI Alisha Fuentes is a 22 y.o. female.  HPI  Patient is a 22 year old female with no significant past medical history presenting for a left ring finger injury.  Patient reports that she was getting ATV off the top shelf of a closet, when she lost her grip of it, and it fell down from the shelf onto her left ring finger.  Patient reports that it is most painful from the mid to distal tip of the left ring finger.  Patient reports that she has had difficulty straightening or bending the finger due to the pain and swelling.  Patient denies any loss of sensation, but does report that she has had increasing swelling over the afternoon.  No pallor to the finger.  No open wound occurred in this incident.  History reviewed. No pertinent past medical history.  There are no active problems to display for this patient.   History reviewed. No pertinent surgical history.  OB History    No data available       Home Medications    Prior to Admission medications   Not on File    Family History No family history on file.  Social History Social History   Tobacco Use  . Smoking status: Never Smoker  . Smokeless tobacco: Never Used  Substance Use Topics  . Alcohol use: Yes    Comment: occ  . Drug use: No     Allergies   Patient has no known allergies.   Review of Systems Review of Systems  Musculoskeletal: Positive for arthralgias and joint swelling.  Skin: Negative for color change and wound.  Neurological: Negative for weakness and numbness.     Physical Exam Updated Vital Signs BP 95/68 (BP Location: Right Arm)   Pulse 80   Temp 98.4 F (36.9 C) (Oral)   Resp 16   Ht 5\' 8"  (1.727 m)   Wt 63.5 kg (140 lb)   LMP 04/15/2017   SpO2 100%   BMI 21.29  kg/m   Physical Exam  Constitutional: She appears well-developed and well-nourished. No distress.  Sitting comfortably in bed.  HENT:  Head: Normocephalic and atraumatic.  Eyes: Conjunctivae are normal. Right eye exhibits no discharge. Left eye exhibits no discharge.  EOMs normal to gross examination.  Neck: Normal range of motion.  Cardiovascular: Normal rate and regular rhythm.  Intact, 2+ radial and ulnar pulse of LUE  Pulmonary/Chest:  Normal respiratory effort. Patient converses comfortably. No audible wheeze or stridor.  Abdominal: She exhibits no distension.  Musculoskeletal: Normal range of motion.  Hand Exam:  Inspection: Mild amount of swelling from the IP joint to DIP joint of the left ring finger. Palpation: Generalized tenderness to palpation along radial and ulnar aspects of the left ring finger. ROM: Passive/active ROM intact at wrist, MCP, PIP, and DIP joints, thumb MCP and IP joints, and no rotational deformity of metacarpals noted. Ligamentous stability: No laxity to valgus/varus stress of MCP, PIP, or DIP joints. No joint laxity with radial stress of thumb. Flexor/Extensor tendons: FDS/FDP tendons intact in digits 2-5 at PIP/DIP joints, respectively; extensor tendons intact in all digits Nerve testing:  Patient has sensation intact in the digital nerve distribution of the entire distribution of the left ring finger.   Vascular: 2+  radial and ulnar pulses. Capillary refill <2 seconds b/l.  Neurological: She is alert.  Cranial nerves intact to gross observation. Patient moves extremities without difficulty.  Skin: Skin is warm and dry. She is not diaphoretic.  Psychiatric: She has a normal mood and affect. Her behavior is normal. Judgment and thought content normal.  Nursing note and vitals reviewed.    ED Treatments / Results  Labs (all labs ordered are listed, but only abnormal results are displayed) Labs Reviewed  POC URINE PREG, ED    EKG  EKG  Interpretation None       Radiology Dg Finger Ring Left  Result Date: 05/07/2017 CLINICAL DATA:  Television fell on finger EXAM: LEFT FOURTH FINGER 2+V COMPARISON:  None. FINDINGS: Frontal, oblique, and lateral views were obtained. No fracture or dislocation. Joint spaces appear normal. No erosive change. IMPRESSION: No fracture or dislocation.  No evident arthropathy. Electronically Signed   By: Bretta Bang III M.D.   On: 05/07/2017 17:40    Procedures Procedures (including critical care time)  Medications Ordered in ED Medications - No data to display   Initial Impression / Assessment and Plan / ED Course  I have reviewed the triage vital signs and the nursing notes.  Pertinent labs & imaging results that were available during my care of the patient were reviewed by me and considered in my medical decision making (see chart for details).  Clinical Course as of May 07 1824  Wynelle Link May 07, 2017  1809 Negative xray noted.  [AM]    Clinical Course User Index [AM] Elisha Ponder, PA-C    Patient is well-appearing in no acute distress.  No fractures identified on radiography of the left ring finger.  Fingers neurovascularly intact, and no evidence of dislocation or ligament rupture.  Will buddy tape the finger and have patient perform range of motion exercises as well as RICE therapy at home.  Patient to use anti-inflammatories.  Return precautions given for any pallor, increasing redness, or swelling.  Patient is in understanding and agrees with the plan of care.  Final Clinical Impressions(s) / ED Diagnoses   Final diagnoses:  Sprain of left ring finger, unspecified site of finger, initial encounter    ED Discharge Orders        Ordered    ibuprofen (ADVIL,MOTRIN) 600 MG tablet  Every 6 hours PRN     05/07/17 1838       Elisha Ponder, PA-C 05/07/17 1901    Blane Ohara, MD 05/08/17 [phone]

## 2017-08-01 ENCOUNTER — Ambulatory Visit (HOSPITAL_COMMUNITY)
Admission: EM | Admit: 2017-08-01 | Discharge: 2017-08-01 | Disposition: A | Discharge status: Home / Self Care | Payer: BLUE CROSS/BLUE SHIELD | Attending: Urgent Care | Admitting: Urgent Care

## 2017-08-01 ENCOUNTER — Encounter (HOSPITAL_COMMUNITY): Payer: Self-pay | Admitting: Family Medicine

## 2017-08-01 DIAGNOSIS — Z566 Other physical and mental strain related to work: Secondary | ICD-10-CM

## 2017-08-01 DIAGNOSIS — R0981 Nasal congestion: Secondary | ICD-10-CM

## 2017-08-01 DIAGNOSIS — G44209 Tension-type headache, unspecified, not intractable: Secondary | ICD-10-CM | POA: Diagnosis not present

## 2017-08-01 MED ORDER — CYCLOBENZAPRINE HCL 5 MG PO TABS: 5.0000 mg | ORAL_TABLET | Freq: Every day | ORAL | 1 refills | Status: DC

## 2017-08-01 MED ORDER — CETIRIZINE HCL 10 MG PO TABS: 10.0000 mg | ORAL_TABLET | Freq: Every day | ORAL | 1 refills | Status: DC

## 2017-08-01 MED ORDER — MELOXICAM 7.5 MG PO TABS: 7.5000 mg | ORAL_TABLET | Freq: Every day | ORAL | 0 refills | Status: DC

## 2017-08-01 NOTE — ED Triage Notes (Signed)
Pt here for headache x 1 week. She has taken ibuprofen and tylenol and she has some relief for a short period of time. Denies any dizziness and blurred vision. sts some congestion and sore throat when she wakes up in the am.

## 2017-08-01 NOTE — ED Provider Notes (Signed)
  MRN: [id number] DOB: 1995-04-13  Subjective:   Alisha Fuentes is a 22 y.o. female presenting for 1 week history of persistent migraine headaches. Headache is frontal. Has felt more forgetful lately. Has intermittent sinus congestion, mostly when she wakes up. Denies dizziness, numbness or tingling, weakness, photophobia, n/v, abdominal pain, sore throat, sinus pain, ear pain. Patient is a Merchandiser, retailsupervisor at [employer], has a lot of stress. Works second shift. Has used ibuprofen for her migraines in the past. Has been using the same recently with short term relief. Has difficulty sleeping, gets ~4 hours per night. Hydrates very well with ~2 liters of water daily. Does not drink coffee. Has occasional alcohol drink.   No current facility-administered medications for this encounter.   Current Outpatient Medications:  .  ibuprofen (ADVIL,MOTRIN) 600 MG tablet, Take 1 tablet (600 mg total) by mouth every 6 (six) hours as needed., Disp: 30 tablet, Rfl: 0   No Known Allergies  Denies past surgical history.  Objective:   Vitals: BP 101/65   Pulse 79   Temp 98.4 F (36.9 C)   Resp 18   LMP 07/13/2017   SpO2 100%   Physical Exam  Constitutional: She is oriented to person, place, and time. She appears well-developed and well-nourished.  HENT:  TMs clear and without erythema.  No sinus tenderness.  There is mucosal edema without rhinorrhea.  Oropharynx is clear.  Eyes: Pupils are equal, round, and reactive to light. EOM are normal. Right eye exhibits no discharge. Left eye exhibits no discharge. No scleral icterus.  Cardiovascular: Normal rate.  Pulmonary/Chest: Effort normal.  Neurological: She is alert and oriented to person, place, and time. She displays normal reflexes. No cranial nerve deficit. Coordination normal.  Skin: Skin is warm and dry.  Psychiatric: She has a normal mood and affect.   Assessment and Plan :   Acute non intractable tension-type headache  Work-related  stress  Nasal congestion  Neurologic exam is very reassuring.  Counseled patient on nature of tension type headaches, work-related stress is likely the source.  Counseled patient on need for adequate sleep every night.  Will use Flexeril to help with this.  For headaches patient can try meloxicam 1-2 times per day.  Recommended she start Zyrtec to address allergies as she wakes up every morning with sinus congestion and postnasal drainage.  Work note provided for the next 2 to 3 days.  Follow-up if headaches persist.   Wallis BambergMani, Keena Heesch, PA-C 08/01/17 1540

## 2018-01-22 ENCOUNTER — Ambulatory Visit (HOSPITAL_COMMUNITY)
Admission: EM | Admit: 2018-01-22 | Discharge: 2018-01-22 | Disposition: A | Discharge status: Home / Self Care | Payer: BLUE CROSS/BLUE SHIELD | Attending: Family Medicine | Admitting: Family Medicine

## 2018-01-22 ENCOUNTER — Encounter (HOSPITAL_COMMUNITY): Payer: Self-pay | Admitting: Emergency Medicine

## 2018-01-22 DIAGNOSIS — J40 Bronchitis, not specified as acute or chronic: Secondary | ICD-10-CM | POA: Insufficient documentation

## 2018-01-22 DIAGNOSIS — N3090 Cystitis, unspecified without hematuria: Secondary | ICD-10-CM | POA: Insufficient documentation

## 2018-01-22 LAB — POCT URINALYSIS DIP (DEVICE)
BILIRUBIN URINE: NEGATIVE
Glucose, UA: NEGATIVE mg/dL
KETONES UR: 15 mg/dL — AB
NITRITE: POSITIVE — AB
Protein, ur: 30 mg/dL — AB
Specific Gravity, Urine: >=1.03 (ref 1.005–1.030)
Urobilinogen, UA: 0.2 mg/dL (ref 0.0–1.0)
pH: 6 (ref 5.0–8.0)

## 2018-01-22 MED ORDER — FLUTICASONE PROPIONATE 50 MCG/ACT NA SUSP: 2.0000 | Freq: Every day | NASAL | 0 refills | Status: DC

## 2018-01-22 MED ORDER — CEPHALEXIN 500 MG PO CAPS: 500.0000 mg | ORAL_CAPSULE | Freq: Two times a day (BID) | ORAL | 0 refills | Status: DC

## 2018-01-22 MED ORDER — IPRATROPIUM BROMIDE 0.06 % NA SOLN: 2.0000 | Freq: Four times a day (QID) | NASAL | 0 refills | Status: DC

## 2018-01-22 MED ORDER — PREDNISONE 50 MG PO TABS: 50.0000 mg | ORAL_TABLET | Freq: Every day | ORAL | 0 refills | Status: DC

## 2018-01-22 NOTE — ED Provider Notes (Signed)
[facility]    CSN: [id number] Arrival date & time: 01/22/18  1243     History   Chief Complaint Chief Complaint  Patient presents with  . Emesis  . Cough    HPI Alisha Fuentes is a 22 y.o. female.   22 year old female comes in for 5 days of URI symptoms. Nasal congestion, sore throat, cough. States has had few episodes nonbilious nobloody vomit when symptoms first started, now it is more post tussive emesis. Denies hematemesis. Had some subjective fever that has resolved. No chills, night sweats. Has had decrease in appetite, but has been continuing fluid intake and has tolerated. No longer having nausea. No abdominal pain. otc cold medicine without relief. positive sick contact. Never smoker.   She has also had few day history of urinary symptoms including frequency, dysuria without obvious hematuria.  Denies vaginal discharge, itching, pain.     History reviewed. No pertinent past medical history.  There are no active problems to display for this patient.   History reviewed. No pertinent surgical history.  OB History   None      Home Medications    Prior to Admission medications   Medication Sig Start Date End Date Taking? Authorizing Provider  cephALEXin (KEFLEX) 500 MG capsule Take 1 capsule (500 mg total) by mouth 2 (two) times daily. 01/22/18   Cathie HoopsYu, Amy V, PA-C  cetirizine (ZYRTEC ALLERGY) 10 MG tablet Take 1 tablet (10 mg total) by mouth daily. 08/01/17   Wallis BambergMani, Mario, PA-C  cyclobenzaprine (FLEXERIL) 5 MG tablet Take 1-2 tablets (5-10 mg total) by mouth at bedtime. Patient not taking: Reported on 01/22/2018 08/01/17   Wallis BambergMani, Mario, PA-C  fluticasone [facility](FLONASE) 50 MCG/ACT nasal spray Place 2 sprays into both nostrils daily. 01/22/18   Cathie HoopsYu, Amy V, PA-C  ibuprofen (ADVIL,MOTRIN) 600 MG tablet Take 1 tablet (600 mg total) by mouth every 6 (six) hours as needed. 05/07/17   Aviva KluverMurray, Alyssa B, PA-C  ipratropium (ATROVENT) 0.06 % nasal spray Place 2 sprays into both  nostrils 4 (four) times daily. 01/22/18   Cathie HoopsYu, Amy V, PA-C  meloxicam (MOBIC) 7.5 MG tablet Take 1-2 tablets (7.5-15 mg total) by mouth daily. Patient not taking: Reported on 01/22/2018 08/01/17   Wallis BambergMani, Mario, PA-C  predniSONE (DELTASONE) 50 MG tablet Take 1 tablet (50 mg total) by mouth daily. 01/22/18   Belinda FisherYu, Amy V, PA-C    Family History History reviewed. No pertinent family history.  Social History Social History   Tobacco Use  . Smoking status: Never Smoker  . Smokeless tobacco: Never Used  Substance Use Topics  . Alcohol use: Yes    Comment: occ  . Drug use: No     Allergies   Patient has no known allergies.   Review of Systems Review of Systems  Reason unable to perform ROS: See HPI as above.     Physical Exam Triage Vital Signs ED Triage Vitals [01/22/18 1342]  Enc Vitals Group     BP (!) 100/55     Pulse Rate 62     Resp 18     Temp 98.5 F (36.9 C)     Temp Source Oral     SpO2 100 %     Weight      Height      Head Circumference      Peak Flow      Pain Score 5     Pain Loc      Pain Edu?  Excl. in GC?    No data found.  Updated Vital Signs BP (!) 100/55 (BP Location: Right Arm)   Pulse 62   Temp 98.5 F (36.9 C) (Oral)   Resp 18   SpO2 100%   Visual Acuity Right Eye Distance:   Left Eye Distance:   Bilateral Distance:    Right Eye Near:   Left Eye Near:    Bilateral Near:     Physical Exam  Constitutional: She is oriented to person, place, and time. She appears well-developed and well-nourished. No distress.  HENT:  Head: Normocephalic and atraumatic.  Right Ear: Tympanic membrane, external ear and ear canal normal. Tympanic membrane is not erythematous and not bulging.  Left Ear: Tympanic membrane, external ear and ear canal normal. Tympanic membrane is not erythematous and not bulging.  Nose: Nose normal. Right sinus exhibits no maxillary sinus tenderness and no frontal sinus tenderness. Left sinus exhibits no maxillary sinus  tenderness and no frontal sinus tenderness.  Mouth/Throat: Uvula is midline, oropharynx is clear and moist and mucous membranes are normal.  Eyes: Pupils are equal, round, and reactive to light. Conjunctivae are normal.  Neck: Normal range of motion. Neck supple.  Cardiovascular: Normal rate, regular rhythm and normal heart sounds. Exam reveals no gallop and no friction rub.  No murmur heard. Pulmonary/Chest: Effort normal and breath sounds normal. She has no decreased breath sounds. She has no wheezes. She has no rhonchi. She has no rales.  Abdominal: Soft. Bowel sounds are normal. She exhibits no mass. There is no tenderness. There is no rebound, no guarding and no CVA tenderness.  Lymphadenopathy:    She has no cervical adenopathy.  Neurological: She is alert and oriented to person, place, and time.  Skin: Skin is warm and dry.  Psychiatric: She has a normal mood and affect. Her behavior is normal. Judgment normal.     UC Treatments / Results  Labs (all labs ordered are listed, but only abnormal results are displayed) Labs Reviewed  POCT URINALYSIS DIP (DEVICE) - Abnormal; Notable for the following components:      Result Value   Ketones, ur 15 (*)    Hgb urine dipstick LARGE (*)    Protein, ur 30 (*)    Nitrite POSITIVE (*)    Leukocytes, UA SMALL (*)    All other components within normal limits  URINE CULTURE    EKG None  Radiology No results found.  Procedures Procedures (including critical care time)  Medications Ordered in UC Medications - No data to display  Initial Impression / Assessment and Plan / UC Course  I have reviewed the triage vital signs and the nursing notes.  Pertinent labs & imaging results that were available during my care of the patient were reviewed by me and considered in my medical decision making (see chart for details).    Discussed with patient history and exam most consistent with viral URI. Symptomatic treatment as needed. Push  fluids. Return precautions given.   Urine dipstick positive for UTI. Start antibiotics as directed. Push fluids. Return precautions given.  Final Clinical Impressions(s) / UC Diagnoses   Final diagnoses:  Bronchitis  Cystitis    ED Prescriptions    Medication Sig Dispense Auth. Provider   predniSONE (DELTASONE) 50 MG tablet Take 1 tablet (50 mg total) by mouth daily. 5 tablet Yu, Amy V, PA-C   fluticasone (FLONASE) 50 MCG/ACT nasal spray Place 2 sprays into both nostrils daily. 1 g Belinda Fisher, PA-C  ipratropium (ATROVENT) 0.06 % nasal spray Place 2 sprays into both nostrils 4 (four) times daily. 15 mL Yu, Amy V, PA-C   cephALEXin (KEFLEX) 500 MG capsule Take 1 capsule (500 mg total) by mouth 2 (two) times daily. 10 capsule Threasa Alpha, [place] 01/22/18 [id number]

## 2018-01-22 NOTE — Discharge Instructions (Signed)
Prednisone for cough. Start flonase, atrovent nasal spray for nasal congestion/drainage. You can use over the counter nasal saline rinse such as neti pot for nasal congestion. Keep hydrated, your urine should be clear to pale yellow in color. Tylenol/motrin for fever and pain. Monitor for any worsening of symptoms, chest pain, shortness of breath, wheezing, swelling of the throat, follow up for reevaluation.   For sore throat/cough try using a honey-based tea. Use 3 teaspoons of honey with juice squeezed from half lemon. Place shaved pieces of ginger into 1/2-1 cup of water and warm over stove top. Then mix the ingredients and repeat every 4 hours as needed.  Your urine was positive for an urinary tract infection. Start keflex as directed. Keep hydrated, your urine should be clear to pale yellow in color. Monitor for any worsening of symptoms, fever, worsening abdominal pain, nausea/vomiting, flank pain, follow up for reevaluation.

## 2018-01-22 NOTE — ED Triage Notes (Signed)
Pt sts vomiting and cough with post tussive vomiting; pt sts some body aches

## 2018-01-24 LAB — URINE CULTURE

## 2018-01-25 ENCOUNTER — Telehealth (HOSPITAL_COMMUNITY): Payer: Self-pay | Admitting: Emergency Medicine

## 2018-01-25 MED ORDER — NITROFURANTOIN MONOHYD MACRO 100 MG PO CAPS: 100.0000 mg | ORAL_CAPSULE | Freq: Two times a day (BID) | ORAL | 0 refills | Status: DC

## 2018-01-25 NOTE — Telephone Encounter (Signed)
Urine culture was positive CITROBACTER SPECIES for resistant to keflex given at urgent care visit. Prescription for macrobid per sent to pharmacy of choice. Attempted to reach patient. No answer at this time. No voicemail set up.

## 2018-01-28 ENCOUNTER — Telehealth (HOSPITAL_COMMUNITY): Payer: Self-pay | Admitting: Emergency Medicine

## 2018-01-28 NOTE — Telephone Encounter (Signed)
Patient called backed, informed her of the results. Will pick up medicine tomorrow morning. All questions answered.

## 2018-01-28 NOTE — Telephone Encounter (Signed)
Attempted to reach patient x2. No answer at this time. No voicemail set up.   

## 2018-01-29 ENCOUNTER — Telehealth (HOSPITAL_COMMUNITY): Payer: Self-pay | Admitting: Emergency Medicine

## 2018-01-29 NOTE — Telephone Encounter (Signed)
Pt called wanting compensation for medication she was prescribed that her UTI was resistant to; explained process and length of time it takes to obtain culture results and pt verbalized understanding to get new RX filled

## 2018-12-14 IMAGING — DX DG FINGER RING 2+V*L*
3 series · 3 of 3 positions shown · non-contrast
Comparison: None.

CLINICAL DATA: Television fell on finger

EXAM:
LEFT FOURTH FINGER 2+V

[finger ap]
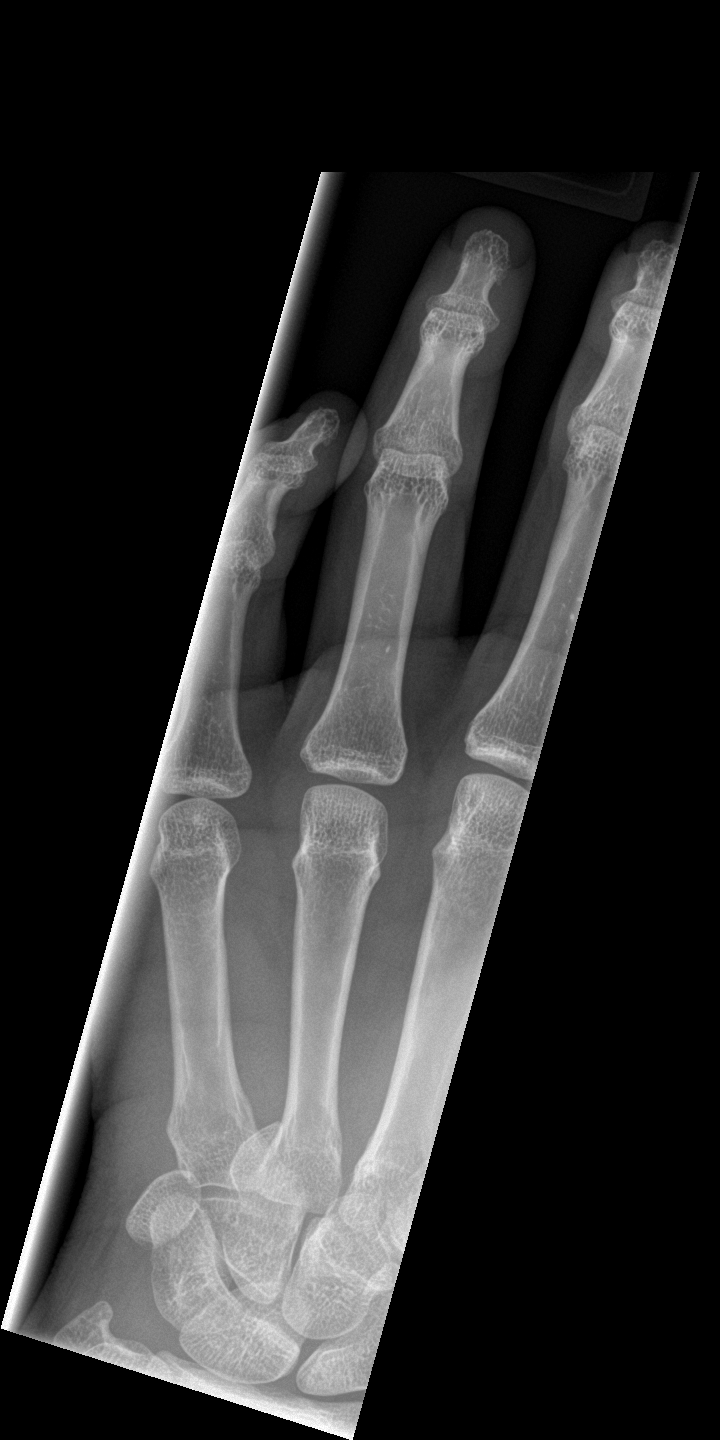

[finger obl]
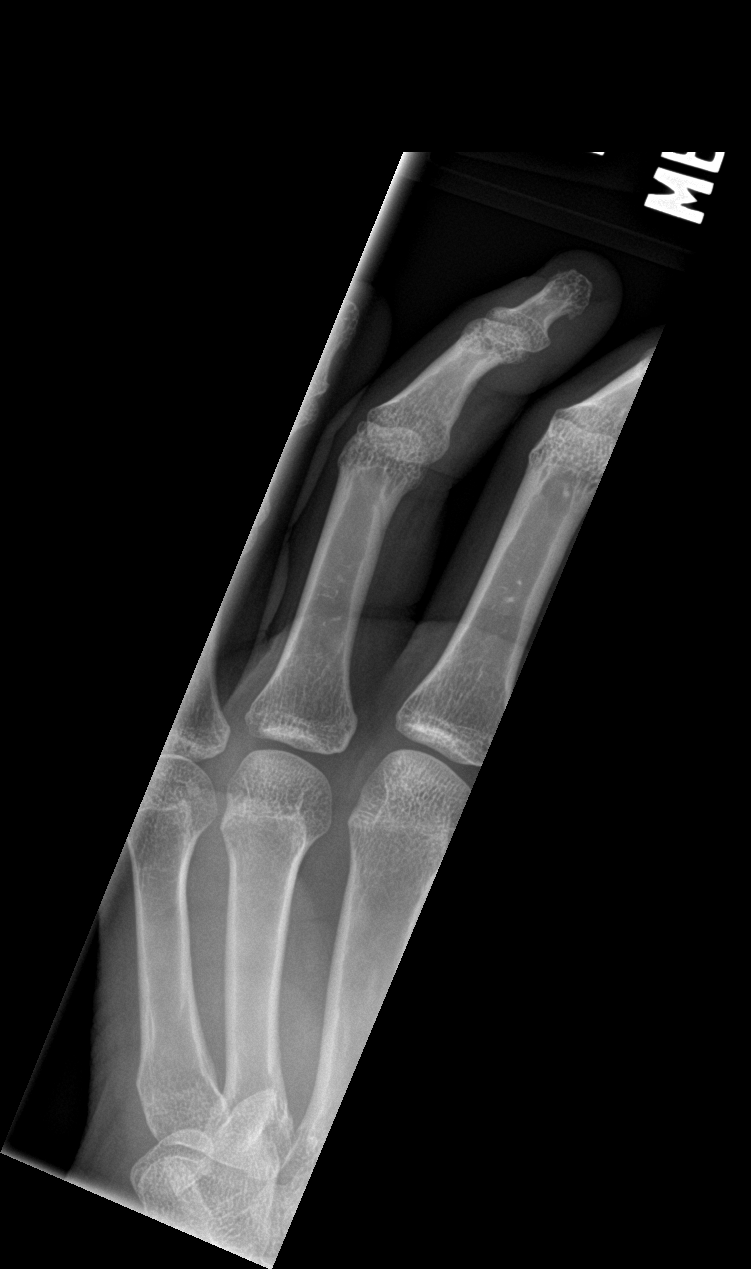

[finger lat]
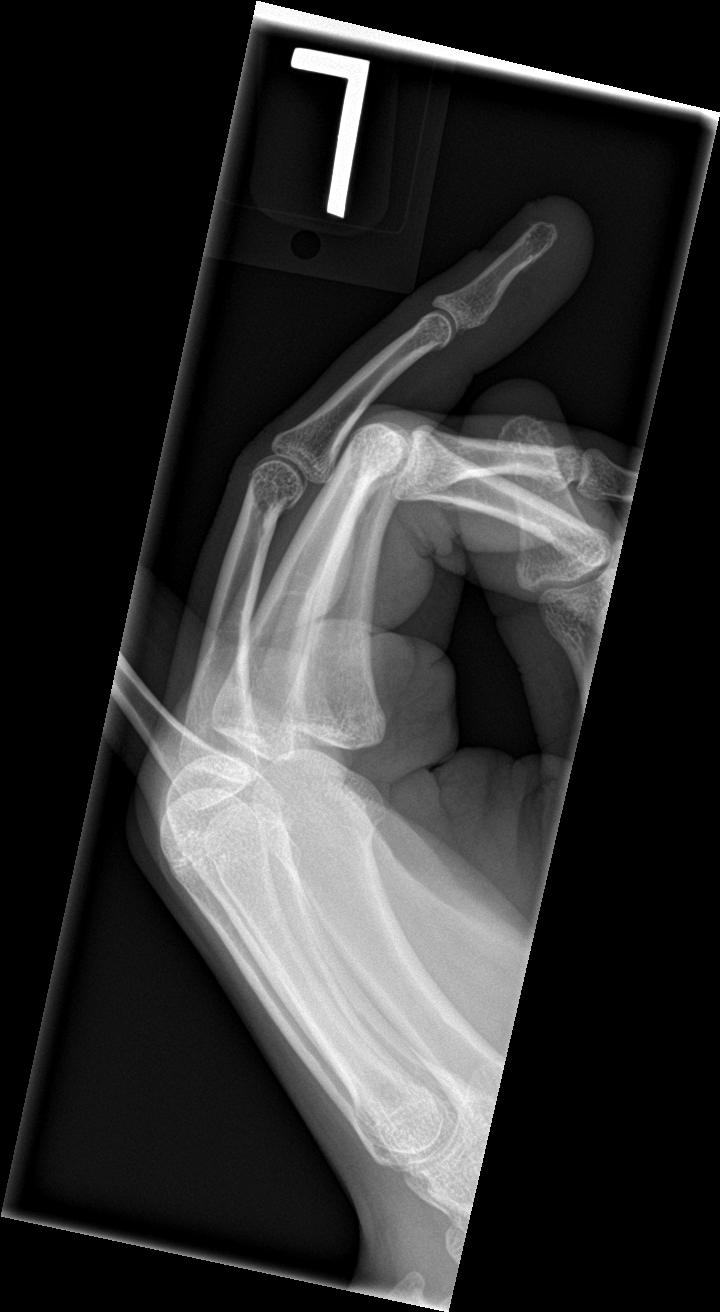

[3 of 3 positions shown; findings below may reference images not displayed]

FINDINGS: Frontal, oblique, and lateral views were obtained. No fracture or
dislocation. Joint spaces appear normal. No erosive change.
IMPRESSION: No fracture or dislocation.  No evident arthropathy.

## 2022-07-22 ENCOUNTER — Encounter (HOSPITAL_BASED_OUTPATIENT_CLINIC_OR_DEPARTMENT_OTHER): Payer: Self-pay

## 2022-07-22 ENCOUNTER — Emergency Department (HOSPITAL_BASED_OUTPATIENT_CLINIC_OR_DEPARTMENT_OTHER): Payer: No Typology Code available for payment source

## 2022-07-22 ENCOUNTER — Emergency Department (HOSPITAL_BASED_OUTPATIENT_CLINIC_OR_DEPARTMENT_OTHER)
Admission: EM | Admit: 2022-07-22 | Discharge: 2022-07-22 | Disposition: A | Discharge status: Home / Self Care | Payer: No Typology Code available for payment source | Attending: Emergency Medicine | Admitting: Emergency Medicine

## 2022-07-22 ENCOUNTER — Other Ambulatory Visit: Payer: Self-pay

## 2022-07-22 DIAGNOSIS — N3000 Acute cystitis without hematuria: Secondary | ICD-10-CM | POA: Diagnosis not present

## 2022-07-22 DIAGNOSIS — R109 Unspecified abdominal pain: Secondary | ICD-10-CM | POA: Diagnosis present

## 2022-07-22 DIAGNOSIS — N83201 Unspecified ovarian cyst, right side: Secondary | ICD-10-CM | POA: Diagnosis not present

## 2022-07-22 DIAGNOSIS — R1084 Generalized abdominal pain: Secondary | ICD-10-CM

## 2022-07-22 LAB — COMPREHENSIVE METABOLIC PANEL
ALT: 15 U/L (ref 0–44)
AST: 19 U/L (ref 15–41)
Albumin: 4 g/dL (ref 3.5–5.0)
Alkaline Phosphatase: 33 U/L — ABNORMAL LOW (ref 38–126)
Anion gap: 8 (ref 5–15)
BUN: 8 mg/dL (ref 6–20)
CO2: 22 mmol/L (ref 22–32)
Calcium: 8.9 mg/dL (ref 8.9–10.3)
Chloride: 106 mmol/L (ref 98–111)
Creatinine, Ser: 0.64 mg/dL (ref 0.44–1.00)
GFR, Estimated: >60 mL/min (ref >60)
Glucose, Bld: 91 mg/dL (ref 70–99)
Potassium: 3.7 mmol/L (ref 3.5–5.1)
Sodium: 136 mmol/L (ref 135–145)
Total Bilirubin: 0.4 mg/dL (ref 0.3–1.2)
Total Protein: 7.4 g/dL (ref 6.5–8.1)

## 2022-07-22 LAB — URINALYSIS, ROUTINE W REFLEX MICROSCOPIC
Bilirubin Urine: NEGATIVE
Glucose, UA: NEGATIVE mg/dL
Ketones, ur: NEGATIVE mg/dL
Nitrite: POSITIVE — AB
Protein, ur: NEGATIVE mg/dL
Specific Gravity, Urine: >=1.03 (ref 1.005–1.030)
pH: 5.5 (ref 5.0–8.0)

## 2022-07-22 LAB — CBC
HCT: 35.7 % — ABNORMAL LOW (ref 36.0–46.0)
Hemoglobin: 11.2 g/dL — ABNORMAL LOW (ref 12.0–15.0)
MCH: 29.9 pg (ref 26.0–34.0)
MCHC: 31.4 g/dL (ref 30.0–36.0)
MCV: 95.2 fL (ref 80.0–100.0)
Platelets: 227 10*3/uL (ref 150–400)
RBC: 3.75 MIL/uL — ABNORMAL LOW (ref 3.87–5.11)
RDW: 12.8 % (ref 11.5–15.5)
WBC: 5.6 10*3/uL (ref 4.0–10.5)
nRBC: 0 % (ref 0.0–0.2)

## 2022-07-22 LAB — URINALYSIS, MICROSCOPIC (REFLEX): Squamous Epithelial / HPF: >50 /HPF (ref 0–5)

## 2022-07-22 LAB — PREGNANCY, URINE: Preg Test, Ur: NEGATIVE

## 2022-07-22 LAB — LIPASE, BLOOD: Lipase: 27 U/L (ref 11–51)

## 2022-07-22 MED ORDER — SODIUM CHLORIDE 0.9 % IV SOLN: INTRAVENOUS | Status: DC | PRN

## 2022-07-22 MED ORDER — KETOROLAC TROMETHAMINE 15 MG/ML IJ SOLN
15.0000 mg | Freq: Once | INTRAMUSCULAR | Status: AC
  Administered 2022-07-22: 15 mg via INTRAVENOUS
  Filled 2022-07-22: qty 1

## 2022-07-22 MED ORDER — IOHEXOL 300 MG/ML  SOLN
80.0000 mL | Freq: Once | INTRAMUSCULAR | Status: AC | PRN
  Administered 2022-07-22: 80 mL via INTRAVENOUS

## 2022-07-22 MED ORDER — NITROFURANTOIN MONOHYD MACRO 100 MG PO CAPS: 100.0000 mg | ORAL_CAPSULE | Freq: Two times a day (BID) | ORAL | 0 refills | Status: DC

## 2022-07-22 MED ORDER — SODIUM CHLORIDE 0.9 % IV BOLUS
1000.0000 mL | Freq: Once | INTRAVENOUS | Status: AC
  Administered 2022-07-22: 1000 mL via INTRAVENOUS

## 2022-07-22 MED ORDER — SODIUM CHLORIDE 0.9 % IV SOLN
2.0000 g | Freq: Once | INTRAVENOUS | Status: AC
  Administered 2022-07-22: 2 g via INTRAVENOUS
  Filled 2022-07-22: qty 20

## 2022-07-22 NOTE — Discharge Instructions (Signed)
As we discussed, laboratory evaluation did show that you have a urinary tract infection that is likely contributing to your symptoms.  We have given you 1 dose of IV antibiotics in the emergency department today for management of this and I have also placed a prescription for oral antibiotics for you to take as prescribed in its entirety for management of the symptoms.  You also have a ovarian cyst on the right side potentially causing your pain.  You need to follow-up with an OB/GYN and have a repeat ultrasound in the next 6 to 12 weeks for reevaluation of this.  In the interim, please be sure to stay adequately hydrated and take Tylenol/ibuprofen as needed for pain.  Return if development of any new or worsening symptoms.

## 2022-07-22 NOTE — ED Triage Notes (Signed)
Patient is having upper ABD pain for a month. She was seen and had an ultrasound. She stated she was diagnosed with a cyst on her ovaries. She has been unable to follow up with OB. She denied vaginal discharge or bleeding.

## 2022-07-22 NOTE — ED Provider Notes (Signed)
[place] [facility] Provider Note   CSN: [id number] Arrival date & time: 07/22/22  1317     History  Chief Complaint  Patient presents with   Abdominal Pain    Alisha Fuentes is a 27 y.o. female.  Patient with no pertinent past medical history presents today with complaints of abdominal pain. She states that same began initially 1 week ago and has been persistent since then. Pain is throughout her abdomen and does not radiate. Denies nausea or vomiting, does endorse some diarrhea. Denies hematuria or dysuria. LMP was 2 weeks ago. States she was seen for similar symptoms back in October 2023 and was seen and had an ultrasound that showed ovarian cysts. She was also diagnosed with a UTI at that time and took macrobid with improvement. She states that her pain is similar in nature to then but more severe this time. Denies fevers or chills. No history of abdominal surgeries. No known sick contacts. Denies vaginal discharge.  The history is provided by the patient. No language interpreter was used.  Abdominal Pain Associated symptoms: diarrhea        Home Medications Prior to Admission medications   Medication Sig Start Date End Date Taking? Authorizing Provider  cephALEXin (KEFLEX) 500 MG capsule Take 1 capsule (500 mg total) by mouth 2 (two) times daily. 01/22/18   Cathie Hoops, Amy V, PA-C  cetirizine (ZYRTEC ALLERGY) 10 MG tablet Take 1 tablet (10 mg total) by mouth daily. 08/01/17   Wallis Bamberg, PA-C  cyclobenzaprine (FLEXERIL) 5 MG tablet Take 1-2 tablets (5-10 mg total) by mouth at bedtime. Patient not taking: Reported on 01/22/2018 08/01/17   Wallis Bamberg, PA-C  fluticasone [facility]) 50 MCG/ACT nasal spray Place 2 sprays into both nostrils daily. 01/22/18   Cathie Hoops, Amy V, PA-C  ibuprofen (ADVIL,MOTRIN) 600 MG tablet Take 1 tablet (600 mg total) by mouth every 6 (six) hours as needed. 05/07/17   Aviva Kluver B, PA-C  ipratropium (ATROVENT) 0.06 % nasal spray  Place 2 sprays into both nostrils 4 (four) times daily. 01/22/18   Cathie Hoops, Amy V, PA-C  meloxicam (MOBIC) 7.5 MG tablet Take 1-2 tablets (7.5-15 mg total) by mouth daily. Patient not taking: Reported on 01/22/2018 08/01/17   Wallis Bamberg, PA-C  nitrofurantoin, macrocrystal-monohydrate, (MACROBID) 100 MG capsule Take 1 capsule (100 mg total) by mouth 2 (two) times daily. 01/25/18   Eustace Moore, MD  predniSONE (DELTASONE) 50 MG tablet Take 1 tablet (50 mg total) by mouth daily. 01/22/18   Belinda Fisher, PA-C      Allergies    Patient has no known allergies.    Review of Systems   Review of Systems  Gastrointestinal:  Positive for abdominal pain and diarrhea.  All other systems reviewed and are negative.   Physical Exam Updated Vital Signs BP 138/89   Pulse 82   Temp 99 F (37.2 C)   Resp 16   Ht 5\' 8"  (1.727 m)   Wt 61.7 kg   LMP 07/03/2022   SpO2 100%   BMI 20.68 kg/m  Physical Exam Vitals and nursing note reviewed.  Constitutional:      General: She is not in acute distress.    Appearance: Normal appearance. She is normal weight. She is not ill-appearing, toxic-appearing or diaphoretic.  HENT:     Head: Normocephalic and atraumatic.  Cardiovascular:     Rate and Rhythm: Normal rate.  Pulmonary:     Effort: Pulmonary effort is normal.  No respiratory distress.  Abdominal:     General: Abdomen is flat.     Palpations: Abdomen is soft.     Tenderness: There is generalized abdominal tenderness.  Musculoskeletal:        General: Normal range of motion.     Cervical back: Normal range of motion.  Skin:    General: Skin is warm and dry.  Neurological:     General: No focal deficit present.     Mental Status: She is alert.  Psychiatric:        Mood and Affect: Mood normal.        Behavior: Behavior normal.     ED Results / Procedures / Treatments   Labs (all labs ordered are listed, but only abnormal results are displayed) Labs Reviewed  COMPREHENSIVE METABOLIC PANEL -  Abnormal; Notable for the following components:      Result Value   Alkaline Phosphatase 33 (*)    All other components within normal limits  CBC - Abnormal; Notable for the following components:   RBC 3.75 (*)    Hemoglobin 11.2 (*)    HCT 35.7 (*)    All other components within normal limits  URINALYSIS, ROUTINE W REFLEX MICROSCOPIC - Abnormal; Notable for the following components:   Color, Urine STRAW (*)    APPearance CLOUDY (*)    Hgb urine dipstick TRACE (*)    Nitrite POSITIVE (*)    Leukocytes,Ua SMALL (*)    All other components within normal limits  URINALYSIS, MICROSCOPIC (REFLEX) - Abnormal; Notable for the following components:   Bacteria, UA MANY (*)    Non Squamous Epithelial PRESENT (*)    All other components within normal limits  LIPASE, BLOOD  PREGNANCY, URINE    EKG None  Radiology No results found.  Procedures Procedures    Medications Ordered in ED Medications  sodium chloride 0.9 % bolus 1,000 mL (has no administration in time range)  ketorolac (TORADOL) 15 MG/ML injection 15 mg (has no administration in time range)  cefTRIAXone (ROCEPHIN) 2 g in sodium chloride 0.9 % 100 mL IVPB (has no administration in time range)  0.9 %  sodium chloride infusion (has no administration in time range)  iohexol (OMNIPAQUE) 300 MG/ML solution 80 mL (80 mLs Intravenous Contrast Given 07/22/22 1657)    ED Course/ Medical Decision Making/ A&P                             Medical Decision Making Amount and/or Complexity of Data Reviewed Labs: ordered. Radiology: ordered.  Risk Prescription drug management.   This patient is a 27 y.o. female who presents to the ED for concern of abdominal pain, this involves an extensive number of treatment options, and is a complaint that carries with it a high risk of complications and morbidity. The emergent differential diagnosis prior to evaluation includes, but is not limited to,  AAA, gastroenteritis, appendicitis, Bowel  obstruction, Bowel perforation. Gastroparesis, DKA, Hernia, Inflammatory bowel disease, mesenteric ischemia, pancreatitis, peritonitis SBP, volvulus.  This is not an exhaustive differential.   Past Medical History / Co-morbidities / Social History: N/A  Physical Exam: Physical exam performed. The pertinent findings include: Generalized abdominal TTP  Lab Tests: I ordered, and personally interpreted labs.  The pertinent results include:  hgb 11.2 unchanged from previous, UA infectious   Imaging Studies: I ordered imaging studies including CT abdomen pelvis, pelvic US. I independently visualized and interpreted imaging which  showed   CT:  Moderate colonic stool. Normal appendix. No obstruction or free air.   5.5 cm right adnexal cystic structure with adjacent free fluid. This is likely ovarian. Please correlate for history. Follow-up by [place] is recommended in 3-6 months. However if there is concern of the possibility of ovarian torsion, would recommend a more urgent ultrasound to assess for potential blood flow of the right ovary.  [place]:  1. Normal appearance of the uterus and left ovary. 2. Heterogeneous solid and cystic mass lesion in the right adnexa measuring 5.3 x 4.0 x 5.0 cm. No vascularity is present within the lesion. Differential diagnosis includes a hemorrhagic cyst or endometrioma. Recommend follow-up pelvic ultrasound at 6-12 weeks to evaluate for resolution. 3. Small to moderate amount of free fluid in the pelvis. 4. No evidence of ovarian torsion.  I agree with the radiologist interpretation.   Medications: I ordered medication including fluids, Rocephin, toradol  for pain, UTI, dehydration. Reevaluation of the patient after these medicines showed that the patient improved. I have reviewed the patients home medicines and have made adjustments as needed.   Disposition: After consideration of the diagnostic results and the patients response to treatment, I feel that  emergency department workup does not suggest an emergent condition requiring admission or immediate intervention beyond what has been performed at this time. The plan is: discharge with macrobid for UTI and close OB/Gyn follow-up. Patient is nontoxic, nonseptic appearing, in no apparent distress.  Patient's pain and other symptoms adequately managed in emergency department.  Fluid bolus given.  Labs, imaging and vitals reviewed.  Patient does not meet the SIRS or Sepsis criteria.  On repeat exam patient does not have a surgical abdomin and there are no peritoneal signs.  No indication of appendicitis, bowel obstruction, bowel perforation, cholecystitis, diverticulitis, PID or ectopic pregnancy.  Evaluation and diagnostic testing in the emergency department does not suggest an emergent condition requiring admission or immediate intervention beyond what has been performed at this time.  Plan for discharge with close PCP follow-up.  Patient is understanding and amenable with plan, educated on red flag symptoms that would prompt immediate return.  Patient discharged in stable condition.   Final Clinical Impression(s) / ED Diagnoses Final diagnoses:  Generalized abdominal pain  Acute cystitis without hematuria  Right ovarian cyst    Rx / DC Orders ED Discharge Orders          Ordered    nitrofurantoin, macrocrystal-monohydrate, (MACROBID) 100 MG capsule  2 times daily        07/22/22 2022          An After Visit Summary was printed and given to the patient.     Vear Clock 07/22/22 2023    Tegeler, Canary Brim, MD 07/22/22 2150

## 2023-06-05 ENCOUNTER — Encounter (HOSPITAL_COMMUNITY): Payer: Self-pay

## 2023-06-05 ENCOUNTER — Emergency Department (HOSPITAL_COMMUNITY)
Admission: EM | Admit: 2023-06-05 | Discharge: 2023-06-06 | Disposition: A | Discharge status: Home / Self Care | Attending: Emergency Medicine | Admitting: Emergency Medicine

## 2023-06-05 ENCOUNTER — Other Ambulatory Visit: Payer: Self-pay

## 2023-06-05 DIAGNOSIS — N3001 Acute cystitis with hematuria: Secondary | ICD-10-CM | POA: Diagnosis not present

## 2023-06-05 DIAGNOSIS — R1030 Lower abdominal pain, unspecified: Secondary | ICD-10-CM | POA: Diagnosis present

## 2023-06-05 LAB — URINALYSIS, ROUTINE W REFLEX MICROSCOPIC
Bilirubin Urine: NEGATIVE
Glucose, UA: NEGATIVE mg/dL
Ketones, ur: NEGATIVE mg/dL
Nitrite: POSITIVE — AB
Protein, ur: >=300 mg/dL — AB
RBC / HPF: >50 RBC/hpf (ref 0–5)
Specific Gravity, Urine: 1.022 (ref 1.005–1.030)
WBC, UA: >50 WBC/hpf (ref 0–5)
pH: 6 (ref 5.0–8.0)

## 2023-06-05 LAB — COMPREHENSIVE METABOLIC PANEL WITH GFR
ALT: 11 U/L (ref 0–44)
AST: 16 U/L (ref 15–41)
Albumin: 4.1 g/dL (ref 3.5–5.0)
Alkaline Phosphatase: 33 U/L — ABNORMAL LOW (ref 38–126)
Anion gap: 8 (ref 5–15)
BUN: 12 mg/dL (ref 6–20)
CO2: 22 mmol/L (ref 22–32)
Calcium: 10 mg/dL (ref 8.9–10.3)
Chloride: 106 mmol/L (ref 98–111)
Creatinine, Ser: 0.79 mg/dL (ref 0.44–1.00)
GFR, Estimated: >60 mL/min (ref >60)
Glucose, Bld: 103 mg/dL — ABNORMAL HIGH (ref 70–99)
Potassium: 4.2 mmol/L (ref 3.5–5.1)
Sodium: 136 mmol/L (ref 135–145)
Total Bilirubin: 0.5 mg/dL (ref 0.0–1.2)
Total Protein: 7.4 g/dL (ref 6.5–8.1)

## 2023-06-05 LAB — CBC WITH DIFFERENTIAL/PLATELET
Abs Immature Granulocytes: 0 10*3/uL (ref 0.00–0.07)
Basophils Absolute: 0 10*3/uL (ref 0.0–0.1)
Basophils Relative: 0 %
Eosinophils Absolute: 0.1 10*3/uL (ref 0.0–0.5)
Eosinophils Relative: 3 %
HCT: 36.7 % (ref 36.0–46.0)
Hemoglobin: 11.8 g/dL — ABNORMAL LOW (ref 12.0–15.0)
Immature Granulocytes: 0 %
Lymphocytes Relative: 36 %
Lymphs Abs: 1.5 10*3/uL (ref 0.7–4.0)
MCH: 30.2 pg (ref 26.0–34.0)
MCHC: 32.2 g/dL (ref 30.0–36.0)
MCV: 93.9 fL (ref 80.0–100.0)
Monocytes Absolute: 0.3 10*3/uL (ref 0.1–1.0)
Monocytes Relative: 7 %
Neutro Abs: 2.3 10*3/uL (ref 1.7–7.7)
Neutrophils Relative %: 54 %
Platelets: 207 10*3/uL (ref 150–400)
RBC: 3.91 MIL/uL (ref 3.87–5.11)
RDW: 12.7 % (ref 11.5–15.5)
WBC: 4.3 10*3/uL (ref 4.0–10.5)
nRBC: 0 % (ref 0.0–0.2)

## 2023-06-05 LAB — LIPASE, BLOOD: Lipase: 29 U/L (ref 11–51)

## 2023-06-05 MED ORDER — HYDROCODONE-ACETAMINOPHEN 5-325 MG PO TABS
1.0000 | ORAL_TABLET | Freq: Once | ORAL | Status: AC
  Administered 2023-06-05: 1 via ORAL
  Filled 2023-06-05: qty 1

## 2023-06-05 NOTE — ED Triage Notes (Signed)
 Complaining of blood in her urine that started today. Lower abdominal pain.

## 2023-06-05 NOTE — ED Provider Triage Note (Signed)
 Emergency Medicine Provider Triage Evaluation Note  Alisha Fuentes , a 28 y.o. female  was evaluated in triage.  Pt complains of pelvic pain, hematuria. Reports pain began today, suprapubic. Reports history of UTIs, states this feels similar. No fevers, nausea or vomiting at home. Endorsing dysuria. No vaginal discharge.   Review of Systems  Positive:  Negative:   Physical Exam  BP 114/79 (BP Location: Right Arm)   Pulse 82   Temp 99 F (37.2 C)   Resp 19   SpO2 100%  Gen:   Awake, no distress   Resp:  Normal effort  MSK:   Moves extremities without difficulty  Other:    Medical Decision Making  Medically screening exam initiated at 9:22 PM.  Appropriate orders placed.  Alisha Fuentes was informed that the remainder of the evaluation will be completed by another provider, this initial triage assessment does not replace that evaluation, and the importance of remaining in the ED until their evaluation is complete.     Adel Aden, PA-C 06/05/23 2124

## 2023-06-06 LAB — PREGNANCY, URINE: Preg Test, Ur: NEGATIVE

## 2023-06-06 MED ORDER — PHENAZOPYRIDINE HCL 100 MG PO TABS
95.0000 mg | ORAL_TABLET | Freq: Once | ORAL | Status: AC
  Administered 2023-06-06: 100 mg via ORAL
  Filled 2023-06-06: qty 1

## 2023-06-06 MED ORDER — SULFAMETHOXAZOLE-TRIMETHOPRIM 800-160 MG PO TABS: 1.0000 | ORAL_TABLET | Freq: Two times a day (BID) | ORAL | 0 refills | Status: AC

## 2023-06-06 MED ORDER — SULFAMETHOXAZOLE-TRIMETHOPRIM 800-160 MG PO TABS
1.0000 | ORAL_TABLET | Freq: Once | ORAL | Status: AC
  Administered 2023-06-06: 1 via ORAL
  Filled 2023-06-06: qty 1

## 2023-06-06 MED ORDER — HYDROCODONE-ACETAMINOPHEN 5-325 MG PO TABS
1.0000 | ORAL_TABLET | Freq: Once | ORAL | Status: AC
  Administered 2023-06-06: 1 via ORAL
  Filled 2023-06-06: qty 1

## 2023-06-06 NOTE — Discharge Instructions (Addendum)
 Evaluation today revealed that you have a UTI with likely bladder infection also known as acute cystitis.  I am starting on Bactrim for treatment.  Please follow-up with your PCP.  If you develop a fever, pain in your flank or worsening lower abdominal pain, nausea vomiting or diarrhea, or any other concerning symptom please return to the ED for further evaluation.  Recommend Azo and ibuprofen for pain.

## 2023-06-06 NOTE — ED Notes (Signed)
 Pt refused vitals states she wants to be left alone

## 2023-06-06 NOTE — ED Provider Notes (Signed)
 [place] [facility] DEPARTMENT AT [facility] Provider Note   CSN: [id number] Arrival date & time: 06/05/23  2102     History  Chief Complaint  Patient presents with   Abdominal Pain   HPI Alisha Fuentes is a 28 y.o. female presenting for dysuria.  Reports history of multiple UTIs since childhood.  Reporting painful urination and blood in the urine.  Symptoms started about 4 days ago.  Also endorsing lower mid abdominal pain.  Denies abnormal vaginal bleeding or discharge.  Denies nausea vomiting diarrhea and fever.  She states that symptoms are very similar to when she has had a UTI in the past.    Abdominal Pain      Home Medications Prior to Admission medications   Medication Sig Start Date End Date Taking? Authorizing Provider  sulfamethoxazole-trimethoprim (BACTRIM DS) 800-160 MG tablet Take 1 tablet by mouth 2 (two) times daily for 7 days. 06/06/23 06/13/23 Yes Gareth Eagle, PA-C      Allergies    Patient has no known allergies.    Review of Systems   Review of Systems  Gastrointestinal:  Positive for abdominal pain.    Physical Exam Updated Vital Signs BP 108/78 (BP Location: Right Arm)   Pulse 65   Temp 98.3 F (36.8 C) (Oral)   Resp 14   Ht 5\' 7"  (1.702 m)   Wt 59.4 kg   LMP 05/15/2023   SpO2 100%   BMI 20.52 kg/m  Physical Exam Constitutional:      Appearance: Normal appearance.  HENT:     Head: Normocephalic.     Nose: Nose normal.  Eyes:     Conjunctiva/sclera: Conjunctivae normal.  Pulmonary:     Effort: Pulmonary effort is normal.  Abdominal:     Tenderness: There is abdominal tenderness in the suprapubic area. There is no right CVA tenderness or left CVA tenderness.  Neurological:     Mental Status: She is alert.  Psychiatric:        Mood and Affect: Mood normal.     ED Results / Procedures / Treatments   Labs (all labs ordered are listed, but only abnormal results are displayed) Labs Reviewed  CBC WITH  DIFFERENTIAL/PLATELET - Abnormal; Notable for the following components:      Result Value   Hemoglobin 11.8 (*)    All other components within normal limits  COMPREHENSIVE METABOLIC PANEL WITH GFR - Abnormal; Notable for the following components:   Glucose, Bld 103 (*)    Alkaline Phosphatase 33 (*)    All other components within normal limits  URINALYSIS, ROUTINE W REFLEX MICROSCOPIC - Abnormal; Notable for the following components:   APPearance CLOUDY (*)    Hgb urine dipstick LARGE (*)    Protein, ur >=300 (*)    Nitrite POSITIVE (*)    Leukocytes,Ua MODERATE (*)    Bacteria, UA MANY (*)    All other components within normal limits  LIPASE, BLOOD  PREGNANCY, URINE    EKG None  Radiology No results found.  Procedures Procedures    Medications Ordered in ED Medications  HYDROcodone-acetaminophen (NORCO/VICODIN) 5-325 MG per tablet 1 tablet (has no administration in time range)  phenazopyridine (PYRIDIUM) tablet 100 mg (has no administration in time range)  sulfamethoxazole-trimethoprim (BACTRIM DS) 800-160 MG per tablet 1 tablet (has no administration in time range)  HYDROcodone-acetaminophen (NORCO/VICODIN) 5-325 MG per tablet 1 tablet (1 tablet Oral Given 06/05/23 2220)    ED Course/ Medical Decision Making/ A&P  Medical Decision Making  28 year old well-appearing female presenting for lower abdominal pain and dysuria.  Exam notable for suprapubic tenderness but otherwise reassuring.  Considered ectopic but unlikely given negative pregnancy test and lack of symptoms.  Also considered appendicitis but unlikely as well given no right lower quadrant tenderness and lack of symptoms and looks well.  Also considered Pilo but unlikely given no flank pain or CVA tenderness.  Suspect this is  acute cystitis.  Reviewed her chart, she did have a urine culture from 2019 which grew Citrobacter species.  Discussed with pharmacy.  Advised Bactrim.  Renal  function normal per labs.  She received 1 dose here and sent the rest to her pharmacy.  Advised Azo and ibuprofen at home for pain.  Discussed return precautions.  Discharged.        Final Clinical Impression(s) / ED Diagnoses Final diagnoses:  Acute cystitis with hematuria    Rx / DC Orders ED Discharge Orders          Ordered    sulfamethoxazole-trimethoprim (BACTRIM DS) 800-160 MG tablet  2 times daily        06/06/23 0843              Rusty Villella K, PA-C 06/06/23 0845    Albertus Hughs, DO 06/06/23 1411
